# Patient Record
Sex: Male | Born: 1960 | Race: White | Hispanic: No | Marital: Married | Smoking: Never smoker
Health system: Southern US, Community
[De-identification: ages and names within clinical notes are randomized; demographics above are authoritative.]

## PROBLEM LIST (undated history)

## (undated) DIAGNOSIS — S329XXA Fracture of unspecified parts of lumbosacral spine and pelvis, initial encounter for closed fracture: Secondary | ICD-10-CM

## (undated) DIAGNOSIS — S86019A Strain of unspecified Achilles tendon, initial encounter: Secondary | ICD-10-CM

## (undated) DIAGNOSIS — S86012A Strain of left Achilles tendon, initial encounter: Secondary | ICD-10-CM

## (undated) DIAGNOSIS — J302 Other seasonal allergic rhinitis: Secondary | ICD-10-CM

## (undated) DIAGNOSIS — T7840XA Allergy, unspecified, initial encounter: Secondary | ICD-10-CM

## (undated) HISTORY — DX: Other seasonal allergic rhinitis: J30.2

## (undated) HISTORY — DX: Fracture of unspecified parts of lumbosacral spine and pelvis, initial encounter for closed fracture: S32.9XXA

## (undated) HISTORY — DX: Allergy, unspecified, initial encounter: T78.40XA

## (undated) HISTORY — PX: POLYPECTOMY: SHX149

## (undated) HISTORY — PX: SEPTOPLASTY: SUR1290

## (undated) HISTORY — PX: PILONIDAL CYST EXCISION: SHX744

---

## 1989-09-12 HISTORY — PX: OTHER SURGICAL HISTORY: SHX169

## 1989-09-12 HISTORY — PX: KNEE ARTHROSCOPY: SUR90

## 2010-07-09 ENCOUNTER — Ambulatory Visit: Payer: Self-pay | Admitting: Family Medicine

## 2010-07-12 ENCOUNTER — Telehealth: Payer: Self-pay | Admitting: *Deleted

## 2010-07-12 LAB — CONVERTED CEMR LAB
Albumin: 4.4 g/dL (ref 3.5–5.2)
Basophils Absolute: 0 10*3/uL (ref 0.0–0.1)
CO2: 33 meq/L — ABNORMAL HIGH (ref 19–32)
Calcium: 9.2 mg/dL (ref 8.4–10.5)
Cholesterol: 195 mg/dL (ref 0–200)
Eosinophils Absolute: 0.2 10*3/uL (ref 0.0–0.7)
Glucose, Bld: 88 mg/dL (ref 70–99)
HCT: 44 % (ref 39.0–52.0)
HDL: 58.8 mg/dL (ref >39.00)
Hemoglobin: 14.7 g/dL (ref 13.0–17.0)
Lymphs Abs: 2 10*3/uL (ref 0.7–4.0)
MCHC: 33.4 g/dL (ref 30.0–36.0)
Neutro Abs: 2.9 10*3/uL (ref 1.4–7.7)
Potassium: 4.9 meq/L (ref 3.5–5.1)
RDW: 13.1 % (ref 11.5–14.6)
Sodium: 140 meq/L (ref 135–145)
TSH: 0.97 microintl units/mL (ref 0.35–5.50)

## 2010-10-12 NOTE — Assessment & Plan Note (Signed)
Summary: NEW TO EST--REQUESTING CPX--WILL FAST//CCM   Vital Signs:  Patient profile:   50 year old male Height:      74.25 inches Weight:      224 pounds BMI:     28.67 Temp:     97.4 degrees F oral Pulse rate:   60 / minute Pulse rhythm:   regular Resp:     12 per minute BP sitting:   120 / 84  (left arm) Cuff size:   large  Vitals Entered By: Sid Falcon LPN (July 09, 2010 10:36 AM)  Nutrition Counseling: Patient's BMI is greater than 25 and therefore counseled on weight management options.  History of Present Illness: New patient to establish care and for complete physical. Past medical history reviewed. No chronic medical problems. Prior surgeries as outlined. Takes Zyrtec for allergies no other medications. No known drug allergies.  Family history father had stroke and questionable arteriovenous malformation age 68. Father with history of hyperlipidemia. Mother with breast cancer history.  He is married has 2 children. Nonsmoker. Drinks about 2-4 beers per day. Emergency planning/management officer. Exercises about three times per week.  Allergies (verified): No Known Drug Allergies  Past History:  Past Medical History: Last updated: 07/09/2010 HIV testing, manditory for [employer] Hay fever, allergies  Past Surgical History: Last updated: 07/09/2010 Tonsillectomy 1968 Pylonidal Cystectomy 1982 Right knee orthroscops 2001 Septoplasty 2001  Family History: Last updated: 07/09/2010 Family History Breast cancer, mother Father, elevated cholesterol, AV stroke Maternal grandmother, diabetes  Social History: Last updated: 07/09/2010 Occupation:  Emergency planning/management officer Never Smoked Alcohol use-yes Regular exercise-yes  Risk Factors: Exercise: yes (07/09/2010)  Risk Factors: Smoking Status: never (07/09/2010) PMH-FH-SH reviewed for relevance  Family History: Family History Breast cancer, mother Father, elevated cholesterol, AV stroke Maternal grandmother, diabetes  Review of  Systems       The patient complains of weight gain.  The patient denies anorexia, fever, weight loss, vision loss, decreased hearing, hoarseness, chest pain, syncope, dyspnea on exertion, peripheral edema, prolonged cough, headaches, hemoptysis, abdominal pain, melena, hematochezia, severe indigestion/heartburn, hematuria, incontinence, genital sores, muscle weakness, suspicious skin lesions, transient blindness, difficulty walking, depression, unusual weight change, abnormal bleeding, enlarged lymph nodes, and testicular masses.    Physical Exam  General:  Well-developed,well-nourished,in no acute distress; alert,appropriate and cooperative throughout examination Head:  Normocephalic and atraumatic without obvious abnormalities. No apparent alopecia or balding. Eyes:  No corneal or conjunctival inflammation noted. EOMI. Perrla. Funduscopic exam benign, without hemorrhages, exudates or papilledema. Vision grossly normal. Ears:  External ear exam shows no significant lesions or deformities.  Otoscopic examination reveals clear canals, tympanic membranes are intact bilaterally without bulging, retraction, inflammation or discharge. Hearing is grossly normal bilaterally. Mouth:  Oral mucosa and oropharynx without lesions or exudates.  Teeth in good repair. Neck:  No deformities, masses, or tenderness noted. Lungs:  Normal respiratory effort, chest expands symmetrically. Lungs are clear to auscultation, no crackles or wheezes. Heart:  Normal rate and regular rhythm. S1 and S2 normal without gallop, murmur, click, rub or other extra sounds. Abdomen:  Bowel sounds positive,abdomen soft and non-tender without masses, organomegaly or hernias noted. Rectal:  No external abnormalities noted. Normal sphincter tone. No rectal masses or tenderness. Prostate:  Prostate gland firm and smooth, no enlargement, nodularity, tenderness, mass, asymmetry or induration. Msk:  No deformity or scoliosis noted of thoracic or  lumbar spine.   Extremities:  No clubbing, cyanosis, edema, or deformity noted with normal full range of motion of all joints.  Neurologic:  alert & oriented X3 and cranial nerves II-XII intact.   Skin:  no rashes and no suspicious lesions.   Cervical Nodes:  No lymphadenopathy noted Psych:  normally interactive, good eye contact, not anxious appearing, and not depressed appearing.     Impression & Recommendations:  Problem # 1:  Preventive Health Care (ICD-V70.0) Obtain screening lab work. Patient will confirm date of last tetanus. Flu vaccine given. Colonoscopy by next year.  Complete Medication List: 1)  Zyrtec Allergy 10 Mg Tabs (Cetirizine hcl) .... Once daily  Other Orders: Admin 1st Vaccine (47829) Flu Vaccine 44yrs + (56213) TLB-Lipid Panel (80061-LIPID) TLB-BMP (Basic Metabolic Panel-BMET) (80048-METABOL) TLB-CBC Platelet - w/Differential (85025-CBCD) TLB-Hepatic/Liver Function Pnl (80076-HEPATIC) TLB-TSH (Thyroid Stimulating Hormone) (84443-TSH) Venipuncture (08657) Specimen Handling (84696)  Patient Instructions: 1)  It is important that you exercise reguarly at least 20 minutes 5 times a week. If you develop chest pain, have severe difficulty breathing, or feel very tired, stop exercising immediately and seek medical attention.  2)  You need to lose weight. Consider a lower calorie diet and regular exercise.    Orders Added: 1)  Admin 1st Vaccine [90471] 2)  Flu Vaccine 66yrs + [90658] 3)  New Patient 40-64 years [99386] 4)  TLB-Lipid Panel [80061-LIPID] 5)  TLB-BMP (Basic Metabolic Panel-BMET) [80048-METABOL] 6)  TLB-CBC Platelet - w/Differential [85025-CBCD] 7)  TLB-Hepatic/Liver Function Pnl [80076-HEPATIC] 8)  TLB-TSH (Thyroid Stimulating Hormone) [84443-TSH] 9)  Venipuncture [29528] 10)  Specimen Handling [99000]    Flu Vaccine Consent Questions     Do you have a history of severe allergic reactions to this vaccine? no    Any prior history of allergic  reactions to egg and/or gelatin? no    Do you have a sensitivity to the preservative Thimersol? no    Do you have a past history of Guillan-Barre Syndrome? no    Do you currently have an acute febrile illness? no    Have you ever had a severe reaction to latex? no    Vaccine information given and explained to patient? yes    Are you currently pregnant? no    Lot Number:[id number]   Exp Date:03/12/2011   Site Given  Left Deltoid IM .lbflu1

## 2010-10-12 NOTE — Progress Notes (Signed)
Summary: Historical Td, 2009  Phone Note Call from Patient   Caller: Patient  [phone] Summary of Call: Pt called to adv that the last date of his most recent Tetanus shot was   5.2.2009.Marland KitchenMarland KitchenMarland KitchenMarland Kitchen Pt would like this info added to his chart.  Initial call taken by: Debbra Riding,  July 12, 2010 9:04 AM      Immunization History:  Tetanus/Td Immunization History:    Tetanus/Td:  historical (01/12/2008)

## 2011-04-21 ENCOUNTER — Emergency Department (HOSPITAL_BASED_OUTPATIENT_CLINIC_OR_DEPARTMENT_OTHER)
Admission: EM | Admit: 2011-04-21 | Discharge: 2011-04-21 | Disposition: A | Discharge status: Home / Self Care | Payer: BC Managed Care – PPO | Attending: Emergency Medicine | Admitting: Emergency Medicine

## 2011-04-21 ENCOUNTER — Emergency Department (INDEPENDENT_AMBULATORY_CARE_PROVIDER_SITE_OTHER): Payer: BC Managed Care – PPO

## 2011-04-21 DIAGNOSIS — R079 Chest pain, unspecified: Secondary | ICD-10-CM

## 2011-04-21 DIAGNOSIS — S2239XA Fracture of one rib, unspecified side, initial encounter for closed fracture: Secondary | ICD-10-CM

## 2011-04-21 MED ORDER — OXYCODONE-ACETAMINOPHEN 5-325 MG PO TABS
2.0000 | ORAL_TABLET | Freq: Once | ORAL | Status: AC
  Administered 2011-04-21: 2 via ORAL
  Filled 2011-04-21: qty 2

## 2011-04-21 MED ORDER — OXYCODONE-ACETAMINOPHEN 5-325 MG PO TABS: 1.0000 | ORAL_TABLET | Freq: Four times a day (QID) | ORAL | Status: AC | PRN

## 2011-04-21 MED ORDER — OXYCODONE HCL 10 MG PO TB12: 10.0000 mg | ORAL_TABLET | Freq: Two times a day (BID) | ORAL | Status: AC
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
## 2011-04-21 NOTE — ED Notes (Signed)
Pt transported to xray, NAD noted.
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
## 2011-09-10 ENCOUNTER — Encounter: Payer: Self-pay | Admitting: Internal Medicine

## 2011-09-10 ENCOUNTER — Ambulatory Visit (INDEPENDENT_AMBULATORY_CARE_PROVIDER_SITE_OTHER): Payer: BC Managed Care – PPO | Admitting: Internal Medicine

## 2011-09-10 VITALS — BP 106/68 | HR 86 | Temp 99.3°F | Resp 14 | Wt 233.0 lb

## 2011-09-10 DIAGNOSIS — R509 Fever, unspecified: Secondary | ICD-10-CM

## 2011-09-10 MED ORDER — AMOXICILLIN 875 MG PO TABS: 875.0000 mg | ORAL_TABLET | Freq: Two times a day (BID) | ORAL | Status: AC

## 2011-09-10 NOTE — Patient Instructions (Signed)
Febrile illness with joint swelling and pain is atypical for influenza and suggests another type of viral infection vs a streptococcal infection. Plan - Amoxicillin 875 mg twice a day; for joint pain it is ok to take an anti-inflammatory drug, e.g. Advil, Aleve; for fever take tylenol 500 mg 1 or 2 three times a day. For persistent fevers or joint pain you will need further evaluation and lab work.

## 2011-09-10 NOTE — Progress Notes (Signed)
  Subjective:    Patient ID: Dean Lewis, male    DOB: 11-Apr-1961, 50 y.o.   MRN: [id number]  HPI Dean Lewis presents for a 1 week h/o fevers, not quantitated, joint and muscle pain, swollen joints. No cough, no congestion, no SOB, no N/V, no sore throat. He is generally healthy.  No past medical history on file. Past Surgical History  Procedure Date  . Pilonidal cyst excision   . Septoplasty    No family history on file. History   Social History  . Marital Status: Married    Spouse Name: N/A    Number of Children: N/A  . Years of Education: N/A   Occupational History  . Not on file.   Social History Main Topics  . Smoking status: Never Smoker   . Smokeless tobacco: Not on file  . Alcohol Use: Yes  . Drug Use: No  . Sexually Active:    Other Topics Concern  . Not on file   Social History Narrative  . No narrative on file       Review of Systems System review is negative for any constitutional, cardiac, pulmonary, GI or neuro symptoms or complaints other than as described in the HPI.     Objective:   Physical Exam Vitals reviewed - low grade fever Gen'l- WNWD white man in no distress HEENT- no sinus tenderness to percussion, throat clear Neck- supple Nodes - negative submandibular, cervical regions Chest - CTAP Cor - RRR w/o murmur Abd - soft Ext - hands, shoulder appear normal. Ankles with 2+ swelling, mild erythema, heat and tenderness Neuro - A&O x 3       Assessment & Plan:  Febrile illness - patient's symptoms are not consistent with influenza. Swollen and inflamed joints are worrisome. Differential includes various viral issues vs streptococcal illness.  Plan - amoxicillin 875 mg bid x 7 days           APAP for fever and discomfort          OTC NSAID of choice for joint pain           For worsening symptoms or failure to clear his symptoms he will need further evaluation to include CBC, ASO, ESR/CRP, repeat physical exam.

## 2011-09-12 ENCOUNTER — Telehealth: Payer: Self-pay

## 2011-09-12 NOTE — Telephone Encounter (Signed)
[facility] Triage Call Report Triage Record Num: [id number] Operator: Tomasita Crumble Patient Name: Dean Lewis Call Date & Time: 09/10/2011 9:06:52AM Patient Phone: [phone] PCP: Evelena Peat Patient Gender: Male PCP Fax : [phone] Patient DOB: October 13, 1960 Practice Name: [facility] Reason for Call: Caller: Millard/Patient; PCP: Caryl Never (Burr-shet), Elberta Fortis.; CB#: [id number]; Call Reason: Chills Body Ache and Fever; Sx Onset: 09/03/2011; Sx Notes:Febrile/subjective - est. 99-100 onset 12/20. Ankles swollen and painful - est twice normal size. Home treatment(s) tried: Antihistamine, Advil; Aleve; Did home treatment help?: No; Guideline Used:Ankle Non-Injury ; Disp:See in 4 hours; Appt Scheduled?: Transferred to Diane at office for appointment. Protocol(s) Used: Ankle Non-Injury Recommended Outcome per Protocol: See Provider within 4 hours Reason for Outcome: New marked swelling (twice the normal size as compared to usual appearance) Care Advice: Analgesic/Antipyretic Advice - Acetaminophen: Consider acetaminophen as directed on label or by pharmacist/provider for pain or fever PRECAUTIONS: - Use if there is no history of liver disease, alcoholism, or intake of three or more alcohol drinks per day - Only if approved by provider during pregnancy or when breastfeeding - During pregnancy, acetaminophen should not be taken more than 3 consecutive days without telling provider - Do not exceed recommended dose or frequency ~ 09/10/2011 9:17:13AM Page 1 of 1 CAN_TriageRpt_V2

## 2011-09-13 HISTORY — PX: COLONOSCOPY: SHX174

## 2011-09-14 ENCOUNTER — Encounter: Payer: Self-pay | Admitting: Family Medicine

## 2011-09-15 ENCOUNTER — Ambulatory Visit (INDEPENDENT_AMBULATORY_CARE_PROVIDER_SITE_OTHER): Payer: BC Managed Care – PPO | Admitting: Family Medicine

## 2011-09-15 ENCOUNTER — Encounter: Payer: Self-pay | Admitting: Family Medicine

## 2011-09-15 VITALS — BP 90/60 | Temp 98.3°F | Wt 233.0 lb

## 2011-09-15 DIAGNOSIS — R6883 Chills (without fever): Secondary | ICD-10-CM

## 2011-09-15 DIAGNOSIS — R5381 Other malaise: Secondary | ICD-10-CM

## 2011-09-15 DIAGNOSIS — R609 Edema, unspecified: Secondary | ICD-10-CM

## 2011-09-15 DIAGNOSIS — M25473 Effusion, unspecified ankle: Secondary | ICD-10-CM

## 2011-09-15 DIAGNOSIS — R5383 Other fatigue: Secondary | ICD-10-CM

## 2011-09-15 LAB — POCT URINALYSIS DIPSTICK
Blood, UA: NEGATIVE
Spec Grav, UA: 1.02
pH, UA: 6

## 2011-09-15 LAB — CBC WITH DIFFERENTIAL/PLATELET
Basophils Relative: 0.5 % (ref 0.0–3.0)
Eosinophils Absolute: 0.1 10*3/uL (ref 0.0–0.7)
Hemoglobin: 14.1 g/dL (ref 13.0–17.0)
MCHC: 34 g/dL (ref 30.0–36.0)
MCV: 88.5 fl (ref 78.0–100.0)
Monocytes Absolute: 0.9 10*3/uL (ref 0.1–1.0)
Neutro Abs: 7.2 10*3/uL (ref 1.4–7.7)
RBC: 4.67 Mil/uL (ref 4.22–5.81)

## 2011-09-15 LAB — BASIC METABOLIC PANEL
Chloride: 103 mEq/L (ref 96–112)
Potassium: 3.5 mEq/L (ref 3.5–5.1)
Sodium: 140 mEq/L (ref 135–145)

## 2011-09-15 NOTE — Progress Notes (Addendum)
  Subjective:    Patient ID: Dean Lewis, male    DOB: Sep 13, 1960, 51 y.o.   MRN: [id number]  HPI  Acute visit. Approximately 3 week history of nonspecific symptoms of malaise body aches and arthralgias mostly involving ankles. Has ankle edema, warmth, and some mild redness over the past couple weeks. Was seen Saturday work in clinic and prescribed amoxicillin. He denies any recent sore throat, cough, abdominal pain, skin rash, headache, or any adenopathy. He had frequent chills and night sweats but temperature not taken. Increased fatigue. Poor appetite. No recent tick bites. No sick exposures. No weight changes.  Taken ibuprofen with minimal relief of ankle pain.   denies any upper extremity arthralgias   Review of Systems  Constitutional: Positive for chills, appetite change and fatigue. Negative for fever and unexpected weight change.  HENT: Negative for neck stiffness.   Respiratory: Negative for cough and shortness of breath.   Gastrointestinal: Negative for nausea, vomiting, abdominal pain and diarrhea.  Genitourinary: Negative for dysuria.  Musculoskeletal: Positive for joint swelling and arthralgias.  Skin: Negative for rash.  Neurological: Negative for headaches.       Objective:   Physical Exam  Constitutional: He appears well-developed and well-nourished. No distress.  HENT:  Right Ear: External ear normal.  Left Ear: External ear normal.  Mouth/Throat: Oropharynx is clear and moist.  Neck: Neck supple. No thyromegaly present.  Cardiovascular: Normal rate, regular rhythm and normal heart sounds.   Pulmonary/Chest: Effort normal and breath sounds normal. No respiratory distress. He has no wheezes. He has no rales.  Abdominal: Soft. He exhibits no distension and no mass. There is no tenderness. There is no rebound and no guarding.  Musculoskeletal: He exhibits edema.       Bilateral ankle edema with mild foot edema. Some mild erythema mostly medial aspect both ankles  with slight warmth and slight tenderness  Lymphadenopathy:    He has no cervical adenopathy.  Skin: No rash noted.          Assessment & Plan:  3 week history of constitutional symptoms of fatigue and malaise and poor appetite. He has objective ankle edema bilateral with inflammatory changes of warmth and mild erythema. Question post viral. Check labs of CBC, urine dipstick, basic metabolic panel, ASO titer, sedimentation rate, ANA, and rheumatoid factor.   Clinical improvement with prednisone but symptoms of edema have returned as prednisone tapered. Call in prednisone refill. Set up rheumatology referral

## 2011-09-16 ENCOUNTER — Other Ambulatory Visit: Payer: Self-pay | Admitting: Family Medicine

## 2011-09-16 MED ORDER — PREDNISONE 20 MG PO TABS: ORAL_TABLET | ORAL | Status: DC

## 2011-09-16 NOTE — Progress Notes (Signed)
Quick Note:  Will send prednisone to pt pharmacy ______

## 2011-09-20 ENCOUNTER — Telehealth: Payer: Self-pay | Admitting: *Deleted

## 2011-09-20 NOTE — Telephone Encounter (Signed)
Pt called as instructed to update Dr Rachel Bo on sx.  He does feel better on the prednisone, still has some minor ankle swelling.  Concerned about what will happen off the Prednisone in a few days. [phone]

## 2011-09-20 NOTE — Telephone Encounter (Signed)
Spoke with patient. He states he is almost 100% improved. Still some very mild edema. No fever. Overall feels well. Wants to resume exercising. He'll finish out prednisone to be in touch if he has recurrent swelling.

## 2011-09-26 ENCOUNTER — Telehealth: Payer: Self-pay | Admitting: Family Medicine

## 2011-09-26 MED ORDER — PREDNISONE 20 MG PO TABS: ORAL_TABLET | ORAL | Status: DC

## 2011-09-26 NOTE — Telephone Encounter (Signed)
rx sent to pharmacy

## 2011-09-26 NOTE — Telephone Encounter (Signed)
Refill prednisone once and will set up rheumatology referral.

## 2011-09-26 NOTE — Telephone Encounter (Signed)
Was told to call back if sxs came back to be referred to a rheumatologist. Please refer. Thanks.

## 2011-09-26 NOTE — Telephone Encounter (Signed)
He is also requesting another refill of his prednisone to be [facility]. Thanks.

## 2011-09-26 NOTE — Progress Notes (Signed)
Addended by: Kristian Covey on: 09/26/2011 10:19 AM   Modules accepted: Orders

## 2011-10-03 ENCOUNTER — Telehealth: Payer: Self-pay | Admitting: Family Medicine

## 2011-10-03 NOTE — Telephone Encounter (Signed)
Refill [facility] at [facility] until Rheumatology appt on 10-17-2011. Thanks.

## 2011-10-04 NOTE — Telephone Encounter (Signed)
Pt informed.  He is taking his last prednisone today and is concerned.  He will call back if swelling becomes severe

## 2011-10-04 NOTE — Telephone Encounter (Signed)
I would not recommend maintaining until then unless swelling is severe for two reasons:  With prolonged use, increased risk of adrenal suppression and would require slow taper off. I would like for rheumatologist to evaluate off prednisone if possible

## 2011-11-24 ENCOUNTER — Other Ambulatory Visit (INDEPENDENT_AMBULATORY_CARE_PROVIDER_SITE_OTHER): Payer: BC Managed Care – PPO

## 2011-11-24 DIAGNOSIS — Z0000 Encounter for general adult medical examination without abnormal findings: Secondary | ICD-10-CM

## 2011-11-24 LAB — LIPID PANEL
HDL: 64.5 mg/dL (ref >39.00)
Triglycerides: 67 mg/dL (ref 0.0–149.0)

## 2011-11-24 LAB — CBC WITH DIFFERENTIAL/PLATELET
Basophils Absolute: 0 10*3/uL (ref 0.0–0.1)
HCT: 46.5 % (ref 39.0–52.0)
Hemoglobin: 15.6 g/dL (ref 13.0–17.0)
Lymphs Abs: 1.3 10*3/uL (ref 0.7–4.0)
MCHC: 33.7 g/dL (ref 30.0–36.0)
Monocytes Relative: 10.8 % (ref 3.0–12.0)
Neutro Abs: 2.9 10*3/uL (ref 1.4–7.7)
RDW: 13.9 % (ref 11.5–14.6)

## 2011-11-24 LAB — POCT URINALYSIS DIPSTICK
Bilirubin, UA: NEGATIVE
Glucose, UA: NEGATIVE
Leukocytes, UA: NEGATIVE
Nitrite, UA: NEGATIVE
Urobilinogen, UA: 0.2

## 2011-11-24 LAB — BASIC METABOLIC PANEL
CO2: 27 mEq/L (ref 19–32)
GFR: 122.44 mL/min (ref >60.00)
Glucose, Bld: 94 mg/dL (ref 70–99)
Potassium: 4 mEq/L (ref 3.5–5.1)
Sodium: 141 mEq/L (ref 135–145)

## 2011-11-24 LAB — HEPATIC FUNCTION PANEL
Bilirubin, Direct: 0.1 mg/dL (ref 0.0–0.3)
Total Bilirubin: 0.4 mg/dL (ref 0.3–1.2)

## 2011-11-24 LAB — PSA: PSA: 0.81 ng/mL (ref 0.10–4.00)

## 2011-12-01 ENCOUNTER — Encounter: Payer: Self-pay | Admitting: Family Medicine

## 2011-12-01 ENCOUNTER — Ambulatory Visit (INDEPENDENT_AMBULATORY_CARE_PROVIDER_SITE_OTHER): Payer: BC Managed Care – PPO | Admitting: Family Medicine

## 2011-12-01 VITALS — BP 120/80 | Temp 97.6°F | Ht 74.0 in | Wt 232.0 lb

## 2011-12-01 DIAGNOSIS — Z0000 Encounter for general adult medical examination without abnormal findings: Secondary | ICD-10-CM

## 2011-12-01 NOTE — Patient Instructions (Signed)
Get back to regular exercise and work on weight loss Reduce alcohol beverages to  2 or less per day. We will call you regarding colonoscopy

## 2011-12-01 NOTE — Progress Notes (Signed)
  Subjective:    Patient ID: Dean Lewis, male    DOB: 10/03/1960, 51 y.o.   MRN: [id number]  HPI  Patient seen for complete physical. 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 His labs and exam have returned to normal at this time. Has been followed by rheumatology.  He had significant lower extremity edema which has resolved. This had improved with prednisone. He had significantly elevated C. reactive protein and other markers were negative.  No significant chronic medical problems. Takes no regular medications. Tetanus up-to-date. Has not done much exercise over the winter and had some gradual weight gain. No history of colon cancer screening.  No past medical history on file. Past Surgical History  Procedure Date  . Pilonidal cyst excision   . Septoplasty   . Knee arthroscopy 1991    R knee    reports that he has never smoked. He does not have any smokeless tobacco history on file. He reports that he drinks alcohol. He reports that he does not use illicit drugs. family history includes Cancer in his mother; Diabetes in his maternal grandmother; Hyperlipidemia in his father; and Stroke in his father. No Known Allergies    Review of Systems  Constitutional: Negative for fever, activity change, appetite change and fatigue.  HENT: Negative for ear pain, congestion and trouble swallowing.   Eyes: Negative for pain and visual disturbance.  Respiratory: Negative for cough, shortness of breath and wheezing.   Cardiovascular: Negative for chest pain and palpitations.  Gastrointestinal: Negative for nausea, vomiting, abdominal pain, diarrhea, constipation, blood in stool, abdominal distention and rectal pain.  Genitourinary: Negative for dysuria, hematuria and testicular pain.  Musculoskeletal: Negative for joint swelling and arthralgias.  Skin: Negative for rash.  Neurological: Negative for  dizziness, syncope and headaches.  Hematological: Negative for adenopathy.  Psychiatric/Behavioral: Negative for confusion and dysphoric mood.       Objective:   Physical Exam  Constitutional: He is oriented to person, place, and time. He appears well-developed and well-nourished. No distress.  HENT:  Head: Normocephalic and atraumatic.  Right Ear: External ear normal.  Left Ear: External ear normal.  Mouth/Throat: Oropharynx is clear and moist.  Eyes: Conjunctivae and EOM are normal. Pupils are equal, round, and reactive to light.  Neck: Normal range of motion. Neck supple. No thyromegaly present.  Cardiovascular: Normal rate, regular rhythm and normal heart sounds.   No murmur heard. Pulmonary/Chest: No respiratory distress. He has no wheezes. He has no rales.  Abdominal: Soft. Bowel sounds are normal. He exhibits no distension and no mass. There is no tenderness. There is no rebound and no guarding.  Musculoskeletal: He exhibits no edema.  Lymphadenopathy:    He has no cervical adenopathy.  Neurological: He is alert and oriented to person, place, and time. He displays normal reflexes. No cranial nerve deficit.  Skin: No rash noted.       No concerning skin lesions.  Psychiatric: He has a normal mood and affect.          Assessment & Plan:  Complete physical. Schedule screening colonoscopy. Reduce alcohol consumption to no more than 2 beverages per day. Work on weight loss and establish more regular exercise. Labs reviewed with patient and all favorable

## 2011-12-07 ENCOUNTER — Encounter: Payer: Self-pay | Admitting: Gastroenterology

## 2012-01-04 ENCOUNTER — Ambulatory Visit (AMBULATORY_SURGERY_CENTER): Payer: BC Managed Care – PPO | Admitting: *Deleted

## 2012-01-04 VITALS — Ht 74.0 in | Wt 237.7 lb

## 2012-01-04 DIAGNOSIS — Z1211 Encounter for screening for malignant neoplasm of colon: Secondary | ICD-10-CM

## 2012-01-04 MED ORDER — MOVIPREP 100 G PO SOLR: ORAL | Status: DC

## 2012-01-05 ENCOUNTER — Encounter: Payer: Self-pay | Admitting: Gastroenterology

## 2012-01-18 ENCOUNTER — Ambulatory Visit (AMBULATORY_SURGERY_CENTER): Payer: BC Managed Care – PPO | Admitting: Gastroenterology

## 2012-01-18 ENCOUNTER — Encounter: Payer: Self-pay | Admitting: Gastroenterology

## 2012-01-18 VITALS — BP 114/81 | HR 70 | Temp 97.3°F | Resp 25 | Ht 74.0 in | Wt 237.0 lb

## 2012-01-18 DIAGNOSIS — D128 Benign neoplasm of rectum: Secondary | ICD-10-CM

## 2012-01-18 DIAGNOSIS — D129 Benign neoplasm of anus and anal canal: Secondary | ICD-10-CM

## 2012-01-18 DIAGNOSIS — D126 Benign neoplasm of colon, unspecified: Secondary | ICD-10-CM

## 2012-01-18 DIAGNOSIS — Z1211 Encounter for screening for malignant neoplasm of colon: Secondary | ICD-10-CM

## 2012-01-18 MED ORDER — SODIUM CHLORIDE 0.9 % IV SOLN: 500.0000 mL | INTRAVENOUS | Status: DC

## 2012-01-18 NOTE — Patient Instructions (Signed)
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
## 2012-01-18 NOTE — Op Note (Signed)
[facility] [address] [place], [place]  [address]  COLONOSCOPY PROCEDURE REPORT  PATIENT:  Dean, Lewis  MR#:  [id number] BIRTHDATE:  1961/03/21, 50 yrs. old  GENDER:  male ENDOSCOPIST:  Vania Rea. Jarold Motto, MD, [facility] [address] REF. BY:  Evelena Peat, M.D. PROCEDURE DATE:  01/18/2012 PROCEDURE:  Colonoscopy with snare polypectomy ASA CLASS:  Class I INDICATIONS:  family Hx of polyps MOTHER MEDICATIONS:   propofol (Diprivan) 250 mg IV  DESCRIPTION OF PROCEDURE:   After the risks and benefits and of the procedure were explained, informed consent was obtained. Digital rectal exam was performed and revealed no abnormalities. The LB [id number] endoscope was introduced through the anus and advanced to the cecum, which was identified by both the appendix and ileocecal valve.  The quality of the prep was excellent, using MoviPrep.  The instrument was then slowly withdrawn as the colon was fully examined. <<PROCEDUREIMAGES>>  FINDINGS:  A sessile polyp was found in the rectum. 5 MM FLAT RECTAL POLYP HOT SNARE REMOVED.   Retroflexed views in the rectum revealed no abnormalities.    The scope was then withdrawn from the patient and the procedure completed.  COMPLICATIONS:  None ENDOSCOPIC IMPRESSION: 1) Sessile polyp in the rectum R/O ADENOMA. RECOMMENDATIONS: 1) Await biopsy results 2) Repeat colonoscopy in 5 years if polyp adenomatous; otherwise 10 years  REPEAT EXAM:  No  ______________________________ Vania Rea. Jarold Motto, MD, [facility]  CC:  n. eSIGNED:   Vania Rea. Askari Kinley at 01/18/2012 09:33 AM  Isidoro Donning, [id number]

## 2012-01-19 ENCOUNTER — Telehealth: Payer: Self-pay | Admitting: *Deleted

## 2012-01-19 NOTE — Telephone Encounter (Signed)
  Follow up Call-  Call back number 01/18/2012  Post procedure Call Back phone  # [phone]  Permission to leave phone message Yes     Patient questions:  Do you have a fever, pain , or abdominal swelling? no Pain Score  0 *  Have you tolerated food without any problems? yes  Have you been able to return to your normal activities? yes  Do you have any questions about your discharge instructions: Diet   no Medications  no Follow up visit  no  Do you have questions or concerns about your Care? no  Actions: * If pain score is 4 or above: No action needed, pain <4.

## 2012-01-24 ENCOUNTER — Encounter: Payer: Self-pay | Admitting: Gastroenterology

## 2012-11-23 ENCOUNTER — Other Ambulatory Visit (INDEPENDENT_AMBULATORY_CARE_PROVIDER_SITE_OTHER): Payer: No Typology Code available for payment source

## 2012-11-23 DIAGNOSIS — Z0000 Encounter for general adult medical examination without abnormal findings: Secondary | ICD-10-CM

## 2012-11-23 LAB — POCT URINALYSIS DIPSTICK
Glucose, UA: NEGATIVE
Ketones, UA: NEGATIVE
Spec Grav, UA: 1.01

## 2012-11-23 LAB — CBC WITH DIFFERENTIAL/PLATELET
Eosinophils Absolute: 0.2 10*3/uL (ref 0.0–0.7)
Eosinophils Relative: 4.5 % (ref 0.0–5.0)
HCT: 45.4 % (ref 39.0–52.0)
Lymphs Abs: 1.6 10*3/uL (ref 0.7–4.0)
MCHC: 34.2 g/dL (ref 30.0–36.0)
MCV: 87.6 fl (ref 78.0–100.0)
Monocytes Absolute: 0.6 10*3/uL (ref 0.1–1.0)
Platelets: 226 10*3/uL (ref 150.0–400.0)
RDW: 12.9 % (ref 11.5–14.6)
WBC: 5.2 10*3/uL (ref 4.5–10.5)

## 2012-11-23 LAB — BASIC METABOLIC PANEL
Calcium: 9.3 mg/dL (ref 8.4–10.5)
GFR: 106.46 mL/min (ref >60.00)
Glucose, Bld: 102 mg/dL — ABNORMAL HIGH (ref 70–99)
Sodium: 140 mEq/L (ref 135–145)

## 2012-11-23 LAB — PSA: PSA: 0.76 ng/mL (ref 0.10–4.00)

## 2012-11-23 LAB — TSH: TSH: 1.28 u[IU]/mL (ref 0.35–5.50)

## 2012-11-23 LAB — LIPID PANEL
Cholesterol: 210 mg/dL — ABNORMAL HIGH (ref 0–200)
HDL: 53.8 mg/dL (ref >39.00)

## 2012-11-23 LAB — HEPATIC FUNCTION PANEL
Alkaline Phosphatase: 55 U/L (ref 39–117)
Bilirubin, Direct: 0.1 mg/dL (ref 0.0–0.3)

## 2012-11-30 ENCOUNTER — Ambulatory Visit (INDEPENDENT_AMBULATORY_CARE_PROVIDER_SITE_OTHER): Payer: No Typology Code available for payment source | Admitting: Family Medicine

## 2012-11-30 ENCOUNTER — Encounter: Payer: Self-pay | Admitting: Family Medicine

## 2012-11-30 VITALS — BP 110/72 | HR 80 | Temp 98.4°F | Resp 12 | Ht 74.0 in | Wt 238.0 lb

## 2012-11-30 DIAGNOSIS — Z0000 Encounter for general adult medical examination without abnormal findings: Secondary | ICD-10-CM

## 2012-11-30 NOTE — Patient Instructions (Addendum)
Try to establish more regular exercise and lose some weight.

## 2012-11-30 NOTE — Progress Notes (Signed)
  Subjective:    Patient ID: Dean Lewis, male    DOB: 02-Dec-1960, 52 y.o.   MRN: [id number]  HPI Patient seen for complete physical /well visit. He's had colonoscopy in the past year showed benign polyps. Tetanus 2009. Had some mild weight gain during past year. Hopes to start exercising soon more consistently.  No chronic medical problems. Takes Zyrtec for allergies but no other medications.  Past Medical History  Diagnosis Date  . Seasonal allergies    Past Surgical History  Procedure Laterality Date  . Pilonidal cyst excision    . Septoplasty    . Knee arthroscopy  1991    R knee    reports that he has never smoked. He does not have any smokeless tobacco history on file. He reports that  drinks alcohol. He reports that he does not use illicit drugs. family history includes Breast cancer in his mother; Cancer in his mother; Diabetes in his maternal grandmother; Hyperlipidemia in his father; and Stroke (age of onset: 37) in his father. No Known Allergies    Review of Systems  Constitutional: Negative for fever, activity change, appetite change and fatigue.  HENT: Negative for ear pain, congestion and trouble swallowing.   Eyes: Negative for pain and visual disturbance.  Respiratory: Negative for cough, shortness of breath and wheezing.   Cardiovascular: Negative for chest pain and palpitations.  Gastrointestinal: Negative for nausea, vomiting, abdominal pain, diarrhea, constipation, blood in stool, abdominal distention and rectal pain.  Genitourinary: Negative for dysuria, hematuria and testicular pain.  Musculoskeletal: Negative for joint swelling and arthralgias.  Skin: Negative for rash.  Neurological: Negative for dizziness, syncope and headaches.  Hematological: Negative for adenopathy.  Psychiatric/Behavioral: Negative for confusion and dysphoric mood.       Objective:   Physical Exam  Constitutional: He is oriented to person, place, and time. He appears  well-developed and well-nourished. No distress.  HENT:  Head: Normocephalic and atraumatic.  Right Ear: External ear normal.  Left Ear: External ear normal.  Mouth/Throat: Oropharynx is clear and moist.  Eyes: Conjunctivae and EOM are normal. Pupils are equal, round, and reactive to light.  Neck: Normal range of motion. Neck supple. No thyromegaly present.  Cardiovascular: Normal rate, regular rhythm and normal heart sounds.   No murmur heard. Pulmonary/Chest: No respiratory distress. He has no wheezes. He has no rales.  Abdominal: Soft. Bowel sounds are normal. He exhibits no distension and no mass. There is no tenderness. There is no rebound and no guarding.  Musculoskeletal: He exhibits no edema.  Lymphadenopathy:    He has no cervical adenopathy.  Neurological: He is alert and oriented to person, place, and time. He displays normal reflexes. No cranial nerve deficit.  Skin: No rash noted.  No concerning skin lesions  Psychiatric: He has a normal mood and affect.          Assessment & Plan:  Complete physical. Tetanus up-to-date. Colonoscopy up to date. PSA normal. Labs reviewed with patient. Mildly elevated glucose 102. Work on weight loss and more consistent exercise. Followup in one year for physical

## 2013-06-14 ENCOUNTER — Encounter: Payer: Self-pay | Admitting: Family Medicine

## 2013-06-14 ENCOUNTER — Ambulatory Visit (INDEPENDENT_AMBULATORY_CARE_PROVIDER_SITE_OTHER): Payer: No Typology Code available for payment source | Admitting: Family Medicine

## 2013-06-14 VITALS — BP 102/60 | HR 66 | Temp 98.1°F | Wt 239.0 lb

## 2013-06-14 DIAGNOSIS — B078 Other viral warts: Secondary | ICD-10-CM

## 2013-06-14 DIAGNOSIS — Z23 Encounter for immunization: Secondary | ICD-10-CM

## 2013-06-14 MED ORDER — CIMETIDINE 800 MG PO TABS: 800.0000 mg | ORAL_TABLET | Freq: Three times a day (TID) | ORAL | Status: DC

## 2013-06-14 NOTE — Progress Notes (Signed)
  Subjective:    Patient ID: Dean Lewis, male    DOB: 23-Jul-1961, 52 y.o.   MRN: [id number]  HPI  Patient has common warts both hands He's had these previously there been treated with several topicals including salicylic acid and cryotherapy without success. His daughter has had similar problems and she recently took course of Tagamet which helped resolve. He has multiple warts including 2 on the right hand and 6 on the left hand.  Past Medical History  Diagnosis Date  . Seasonal allergies    Past Surgical History  Procedure Laterality Date  . Pilonidal cyst excision    . Septoplasty    . Knee arthroscopy  1991    R knee    reports that he has never smoked. He does not have any smokeless tobacco history on file. He reports that  drinks alcohol. He reports that he does not use illicit drugs. family history includes Breast cancer in his mother; Cancer in his mother; Diabetes in his maternal grandmother; Hyperlipidemia in his father; Stroke (age of onset: 51) in his father. No Known Allergies    Review of Systems  Skin: Negative for rash.       Objective:   Physical Exam  Constitutional: He appears well-developed and well-nourished.  Cardiovascular: Normal rate and regular rhythm.   Skin:  Patient several typical common warts including 2 on the right hand and 6 scattered on the left hand involving several digits          Assessment & Plan:  Common warts. We discussed treatment options. He has already tried multiple topicals as well as duct taping and cryotherapy without success. We discussed oral treatments which he is expressed in explicit interest in. We discussed the fact that Tagamet has inconclusive results with some trials showing benefit and some not. We elected to try Tagamet 800 mg 3 times a day

## 2013-06-14 NOTE — Patient Instructions (Addendum)
Warts  Warts are a common viral infection. They are most commonly caused by the human papillomavirus (HPV). Warts can occur at all ages. However, they occur most frequently in older children and infrequently in the elderly. Warts may be single or multiple. Location and size varies. Warts can be spread by scratching the wart and then scratching normal skin. The life cycle of warts varies. However, most will disappear over many months to a couple years. Warts commonly do not cause problems (asymptomatic) unless they are over an area of pressure, such as the bottom of the foot. If they are large enough, they may cause pain with walking.  DIAGNOSIS   Warts are most commonly diagnosed by their appearance. Tissue samples (biopsies) are not required unless the wart looks abnormal. Most warts have a rough surface, are round, oval, or irregular, and are skin-colored to light yellow, brown, or gray. They are generally less than  inch (1.3 cm), but they can be any size.  TREATMENT    Observation or no treatment.   Freezing with liquid nitrogen.   High heat (cautery).   Boosting the body's immunity to fight off the wart (immunotherapy using Candida antigen).   Laser surgery.   Application of various irritants and solutions.  HOME CARE INSTRUCTIONS   Follow your caregiver's instructions. No special precautions are necessary. Often, treatment may be followed by a return (recurrence) of warts. Warts are generally difficult to treat and get rid of. If treatment is done in a clinic setting, usually more than 1 treatment is required. This is usually done on only a monthly basis until the wart is completely gone.  SEEK IMMEDIATE MEDICAL CARE IF:  The treated skin becomes red, puffy (swollen), or painful.  Document Released: 06/08/2005 Document Revised: 11/21/2011 Document Reviewed: 12/04/2009  Exit[employer] Patient Information 2014 [employer].

## 2014-06-17 ENCOUNTER — Other Ambulatory Visit (INDEPENDENT_AMBULATORY_CARE_PROVIDER_SITE_OTHER): Payer: No Typology Code available for payment source

## 2014-06-17 DIAGNOSIS — Z0000 Encounter for general adult medical examination without abnormal findings: Secondary | ICD-10-CM

## 2014-06-17 LAB — POCT URINALYSIS DIPSTICK
BILIRUBIN UA: NEGATIVE
Blood, UA: NEGATIVE
Glucose, UA: NEGATIVE
KETONES UA: NEGATIVE
LEUKOCYTES UA: NEGATIVE
Nitrite, UA: NEGATIVE
PROTEIN UA: NEGATIVE
SPEC GRAV UA: 1.015
Urobilinogen, UA: 0.2
pH, UA: 7.5

## 2014-06-17 LAB — CBC WITH DIFFERENTIAL/PLATELET
BASOS ABS: 0.1 10*3/uL (ref 0.0–0.1)
BASOS PCT: 1 % (ref 0.0–3.0)
Eosinophils Absolute: 0.4 10*3/uL (ref 0.0–0.7)
Eosinophils Relative: 7.2 % — ABNORMAL HIGH (ref 0.0–5.0)
HCT: 46.9 % (ref 39.0–52.0)
HEMOGLOBIN: 15.9 g/dL (ref 13.0–17.0)
LYMPHS PCT: 27.8 % (ref 12.0–46.0)
Lymphs Abs: 1.5 10*3/uL (ref 0.7–4.0)
MCHC: 33.9 g/dL (ref 30.0–36.0)
MCV: 88.1 fl (ref 78.0–100.0)
MONOS PCT: 11.8 % (ref 3.0–12.0)
Monocytes Absolute: 0.6 10*3/uL (ref 0.1–1.0)
NEUTROS ABS: 2.8 10*3/uL (ref 1.4–7.7)
NEUTROS PCT: 52.2 % (ref 43.0–77.0)
Platelets: 219 10*3/uL (ref 150.0–400.0)
RBC: 5.33 Mil/uL (ref 4.22–5.81)
RDW: 13 % (ref 11.5–15.5)
WBC: 5.3 10*3/uL (ref 4.0–10.5)

## 2014-06-18 LAB — BASIC METABOLIC PANEL
BUN: 12 mg/dL (ref 6–23)
CALCIUM: 9.1 mg/dL (ref 8.4–10.5)
CO2: 25 mEq/L (ref 19–32)
Chloride: 104 mEq/L (ref 96–112)
Creatinine, Ser: 0.7 mg/dL (ref 0.4–1.5)
GFR: 125.23 mL/min (ref >60.00)
Glucose, Bld: 79 mg/dL (ref 70–99)
Potassium: 4.1 mEq/L (ref 3.5–5.1)
SODIUM: 139 meq/L (ref 135–145)

## 2014-06-18 LAB — TSH: TSH: 1.17 u[IU]/mL (ref 0.35–4.50)

## 2014-06-18 LAB — HEPATIC FUNCTION PANEL
ALBUMIN: 4.5 g/dL (ref 3.5–5.2)
ALK PHOS: 59 U/L (ref 39–117)
ALT: 36 U/L (ref 0–53)
AST: 26 U/L (ref 0–37)
BILIRUBIN DIRECT: 0.2 mg/dL (ref 0.0–0.3)
TOTAL PROTEIN: 7.8 g/dL (ref 6.0–8.3)
Total Bilirubin: 0.7 mg/dL (ref 0.2–1.2)

## 2014-06-18 LAB — LIPID PANEL
CHOLESTEROL: 241 mg/dL — AB (ref 0–200)
HDL: 52.5 mg/dL (ref >39.00)
LDL CALC: 167 mg/dL — AB (ref 0–99)
NonHDL: 188.5
Total CHOL/HDL Ratio: 5
Triglycerides: 106 mg/dL (ref 0.0–149.0)
VLDL: 21.2 mg/dL (ref 0.0–40.0)

## 2014-06-18 LAB — PSA: PSA: 0.74 ng/mL (ref 0.10–4.00)

## 2014-06-23 ENCOUNTER — Encounter: Payer: Self-pay | Admitting: Family Medicine

## 2014-06-23 ENCOUNTER — Ambulatory Visit (INDEPENDENT_AMBULATORY_CARE_PROVIDER_SITE_OTHER): Payer: No Typology Code available for payment source | Admitting: Family Medicine

## 2014-06-23 VITALS — BP 116/70 | HR 67 | Temp 97.8°F | Ht 74.0 in | Wt 239.0 lb

## 2014-06-23 DIAGNOSIS — Z23 Encounter for immunization: Secondary | ICD-10-CM

## 2014-06-23 DIAGNOSIS — Z0000 Encounter for general adult medical examination without abnormal findings: Secondary | ICD-10-CM

## 2014-06-23 NOTE — Patient Instructions (Signed)
Fat and Cholesterol Control Diet Fat and cholesterol levels in your blood and organs are influenced by your diet. High levels of fat and cholesterol may lead to diseases of the heart, small and large blood vessels, gallbladder, liver, and pancreas. CONTROLLING FAT AND CHOLESTEROL WITH DIET Although exercise and lifestyle factors are important, your diet is key. That is because certain foods are known to raise cholesterol and others to lower it. The goal is to balance foods for their effect on cholesterol and more importantly, to replace saturated and trans fat with other types of fat, such as monounsaturated fat, polyunsaturated fat, and omega-3 fatty acids. On average, a person should consume no more than 15 to 17 g of saturated fat daily. Saturated and trans fats are considered "bad" fats, and they will raise LDL cholesterol. Saturated fats are primarily found in animal products such as meats, butter, and cream. However, that does not mean you need to give up all your favorite foods. Today, there are good tasting, low-fat, low-cholesterol substitutes for most of the things you like to eat. Choose low-fat or nonfat alternatives. Choose round or loin cuts of red meat. These types of cuts are lowest in fat and cholesterol. Chicken (without the skin), fish, veal, and ground turkey breast are great choices. Eliminate fatty meats, such as hot dogs and salami. Even shellfish have little or no saturated fat. Have a 3 oz (85 g) portion when you eat lean meat, poultry, or fish. Trans fats are also called "partially hydrogenated oils." They are oils that have been scientifically manipulated so that they are solid at room temperature resulting in a longer shelf life and improved taste and texture of foods in which they are added. Trans fats are found in stick margarine, some tub margarines, cookies, crackers, and baked goods.  When baking and cooking, oils are a great substitute for butter. The monounsaturated oils are  especially beneficial since it is believed they lower LDL and raise HDL. The oils you should avoid entirely are saturated tropical oils, such as coconut and palm.  Remember to eat a lot from food groups that are naturally free of saturated and trans fat, including fish, fruit, vegetables, beans, grains (barley, rice, couscous, bulgur wheat), and pasta (without cream sauces).  IDENTIFYING FOODS THAT LOWER FAT AND CHOLESTEROL  Soluble fiber may lower your cholesterol. This type of fiber is found in fruits such as apples, vegetables such as broccoli, potatoes, and carrots, legumes such as beans, peas, and lentils, and grains such as barley. Foods fortified with plant sterols (phytosterol) may also lower cholesterol. You should eat at least 2 g per day of these foods for a cholesterol lowering effect.  Read package labels to identify low-saturated fats, trans fat free, and low-fat foods at the supermarket. Select cheeses that have only 2 to 3 g saturated fat per ounce. Use a heart-healthy tub margarine that is free of trans fats or partially hydrogenated oil. When buying baked goods (cookies, crackers), avoid partially hydrogenated oils. Breads and muffins should be made from whole grains (whole-wheat or whole oat flour, instead of "flour" or "enriched flour"). Buy non-creamy canned soups with reduced salt and no added fats.  FOOD PREPARATION TECHNIQUES  Never deep-fry. If you must fry, either stir-fry, which uses very little fat, or use non-stick cooking sprays. When possible, broil, bake, or roast meats, and steam vegetables. Instead of putting butter or margarine on vegetables, use lemon and herbs, applesauce, and cinnamon (for squash and sweet potatoes). Use nonfat   yogurt, salsa, and low-fat dressings for salads.  LOW-SATURATED FAT / LOW-FAT FOOD SUBSTITUTES Meats / Saturated Fat (g)  Avoid: Steak, marbled (3 oz/85 g) / 11 g  Choose: Steak, lean (3 oz/85 g) / 4 g  Avoid: Hamburger (3 oz/85 g) / 7  g  Choose: Hamburger, lean (3 oz/85 g) / 5 g  Avoid: Ham (3 oz/85 g) / 6 g  Choose: Ham, lean cut (3 oz/85 g) / 2.4 g  Avoid: Chicken, with skin, dark meat (3 oz/85 g) / 4 g  Choose: Chicken, skin removed, dark meat (3 oz/85 g) / 2 g  Avoid: Chicken, with skin, light meat (3 oz/85 g) / 2.5 g  Choose: Chicken, skin removed, light meat (3 oz/85 g) / 1 g Dairy / Saturated Fat (g)  Avoid: Whole milk (1 cup) / 5 g  Choose: Low-fat milk, 2% (1 cup) / 3 g  Choose: Low-fat milk, 1% (1 cup) / 1.5 g  Choose: Skim milk (1 cup) / 0.3 g  Avoid: Hard cheese (1 oz/28 g) / 6 g  Choose: Skim milk cheese (1 oz/28 g) / 2 to 3 g  Avoid: Cottage cheese, 4% fat (1 cup) / 6.5 g  Choose: Low-fat cottage cheese, 1% fat (1 cup) / 1.5 g  Avoid: Ice cream (1 cup) / 9 g  Choose: Sherbet (1 cup) / 2.5 g  Choose: Nonfat frozen yogurt (1 cup) / 0.3 g  Choose: Frozen fruit bar / trace  Avoid: Whipped cream (1 tbs) / 3.5 g  Choose: Nondairy whipped topping (1 tbs) / 1 g Condiments / Saturated Fat (g)  Avoid: Mayonnaise (1 tbs) / 2 g  Choose: Low-fat mayonnaise (1 tbs) / 1 g  Avoid: Butter (1 tbs) / 7 g  Choose: Extra light margarine (1 tbs) / 1 g  Avoid: Coconut oil (1 tbs) / 11.8 g  Choose: Olive oil (1 tbs) / 1.8 g  Choose: Corn oil (1 tbs) / 1.7 g  Choose: Safflower oil (1 tbs) / 1.2 g  Choose: Sunflower oil (1 tbs) / 1.4 g  Choose: Soybean oil (1 tbs) / 2.4 g  Choose: Canola oil (1 tbs) / 1 g Document Released: 08/29/2005 Document Revised: 12/24/2012 Document Reviewed: 11/27/2013 Exit[employer] Patient Information 2015 [employer]. This information is not intended to replace advice given to you by your health care provider. Make sure you discuss any questions you have with your health care provider. Cholesterol Cholesterol is a white, waxy, fat-like substance needed by your body in small amounts. The liver makes all the cholesterol you need. Cholesterol is carried from the liver  by the blood through the blood vessels. Deposits of cholesterol (plaque) may build up on blood vessel walls. These make the arteries narrower and stiffer. Cholesterol plaques increase the risk for heart attack and stroke.  You cannot feel your cholesterol level even if it is very high. The only way to know it is high is with a blood test. Once you know your cholesterol levels, you should keep a record of the test results. Work with your health care provider to keep your levels in the desired range.  WHAT DO THE RESULTS MEAN?  Total cholesterol is a rough measure of all the cholesterol in your blood.   LDL is the so-called bad cholesterol. This is the type that deposits cholesterol in the walls of the arteries. You want this level to be low.   HDL is the good cholesterol because it cleans the arteries   and carries the LDL away. You want this level to be high.  Triglycerides are fat that the body can either burn for energy or store. High levels are closely linked to heart disease.  WHAT ARE THE DESIRED LEVELS OF CHOLESTEROL?  Total cholesterol below 200.   LDL below 100 for people at risk, below 70 for those at very high risk.   HDL above 50 is good, above 60 is best.   Triglycerides below 150.  HOW CAN I LOWER MY CHOLESTEROL?  Diet. Follow your diet programs as directed by your health care provider.   Choose fish or white meat chicken and turkey, roasted or baked. Limit fatty cuts of red meat, fried foods, and processed meats, such as sausage and lunch meats.   Eat lots of fresh fruits and vegetables.  Choose whole grains, beans, pasta, potatoes, and cereals.   Use only small amounts of olive, corn, or canola oils.   Avoid butter, mayonnaise, shortening, or palm kernel oils.  Avoid foods with trans fats.   Drink skim or nonfat milk and eat low-fat or nonfat yogurt and cheeses. Avoid whole milk, cream, ice cream, egg yolks, and full-fat cheeses.   Healthy desserts  include angel food cake, ginger snaps, animal crackers, hard candy, popsicles, and low-fat or nonfat frozen yogurt. Avoid pastries, cakes, pies, and cookies.   Exercise. Follow your exercise programs as directed by your health care provider.   A regular program helps decrease LDL and raise HDL.   A regular program helps with weight control.   Do things that increase your activity level like gardening, walking, or taking the stairs. Ask your health care provider about how you can be more active in your daily life.   Medicine. Take medicine only as directed by your health care provider.   Medicine may be prescribed by your health care provider to help lower cholesterol and decrease the risk for heart disease.   If you have several risk factors, you may need medicine even if your levels are normal. Document Released: 05/24/2001 Document Revised: 01/13/2014 Document Reviewed: 06/12/2013 ExitCare Patient Information 2015 [employer]. This information is not intended to replace advice given to you by your health care provider. Make sure you discuss any questions you have with your health care provider.  

## 2014-06-23 NOTE — Progress Notes (Signed)
   Subjective:    Patient ID: Dean Lewis, male    DOB: 03-22-1961, 53 y.o.   MRN: [id number]  HPI Patient here for complete physical. Generally very healthy. History of mild elevated cholesterol. Takes no regular medications. He had colonoscopy year and one half ago with recommended five-year followup. No consistent exercise. No recent chest pains but sometimes feels more winded with exercise which he attributes to deconditioning. Needs flu vaccine. Tetanus up-to-date.  Past Medical History  Diagnosis Date  . Seasonal allergies    Past Surgical History  Procedure Laterality Date  . Pilonidal cyst excision    . Septoplasty    . Knee arthroscopy  1991    R knee    reports that he has never smoked. He does not have any smokeless tobacco history on file. He reports that he drinks alcohol. He reports that he does not use illicit drugs. family history includes Breast cancer in his mother; Cancer in his mother; Diabetes in his maternal grandmother; Hyperlipidemia in his father; Stroke (age of onset: 2) in his father. No Known Allergies    Review of Systems  Constitutional: Negative for fever, activity change, appetite change and fatigue.  HENT: Negative for congestion, ear pain and trouble swallowing.   Eyes: Negative for pain and visual disturbance.  Respiratory: Negative for cough, shortness of breath and wheezing.   Cardiovascular: Negative for chest pain and palpitations.  Gastrointestinal: Negative for nausea, vomiting, abdominal pain, diarrhea, constipation, blood in stool, abdominal distention and rectal pain.  Genitourinary: Negative for dysuria, hematuria and testicular pain.  Musculoskeletal: Negative for arthralgias and joint swelling.  Skin: Negative for rash.  Neurological: Negative for dizziness, syncope and headaches.  Hematological: Negative for adenopathy.  Psychiatric/Behavioral: Negative for confusion and dysphoric mood.       Objective:   Physical Exam    Constitutional: He is oriented to person, place, and time. He appears well-developed and well-nourished. No distress.  HENT:  Head: Normocephalic and atraumatic.  Right Ear: External ear normal.  Left Ear: External ear normal.  Mouth/Throat: Oropharynx is clear and moist.  Eyes: Conjunctivae and EOM are normal. Pupils are equal, round, and reactive to light.  Neck: Normal range of motion. Neck supple. No thyromegaly present.  Cardiovascular: Normal rate, regular rhythm and normal heart sounds.   No murmur heard. Pulmonary/Chest: No respiratory distress. He has no wheezes. He has no rales.  Abdominal: Soft. Bowel sounds are normal. He exhibits no distension and no mass. There is no tenderness. There is no rebound and no guarding.  Musculoskeletal: He exhibits no edema.  Lymphadenopathy:    He has no cervical adenopathy.  Neurological: He is alert and oriented to person, place, and time. He displays normal reflexes. No cranial nerve deficit.  Skin: No rash noted.  Psychiatric: He has a normal mood and affect.          Assessment & Plan:  Complete physical. Flu vaccine given. Tetanus up-to-date. We discussed his hyperlipidemia. 6% 10 year risk of CAD. He prefers diet and lifestyle modification and recheck in 1 year. Reduce saturated fats and trans-fats. Increase soluble fiber. Establish more consistent exercise

## 2014-06-23 NOTE — Progress Notes (Signed)
Pre visit review using our clinic review tool, if applicable. No additional management support is needed unless otherwise documented below in the visit note. 

## 2015-09-13 HISTORY — PX: OTHER SURGICAL HISTORY: SHX169

## 2016-05-04 ENCOUNTER — Encounter (HOSPITAL_BASED_OUTPATIENT_CLINIC_OR_DEPARTMENT_OTHER): Payer: Self-pay | Admitting: *Deleted

## 2016-05-05 ENCOUNTER — Encounter (HOSPITAL_BASED_OUTPATIENT_CLINIC_OR_DEPARTMENT_OTHER): Payer: Self-pay | Admitting: Physician Assistant

## 2016-05-05 DIAGNOSIS — S86012A Strain of left Achilles tendon, initial encounter: Secondary | ICD-10-CM

## 2016-05-05 HISTORY — DX: Strain of left Achilles tendon, initial encounter: S86.012A

## 2016-05-05 NOTE — H&P (Signed)
Dean Lewis is an 55 y.o. male.   Chief Complaint: left ankle pain HPI: Patient injured his left achilles 1 week ago playing softball    MRI confirmed achilles tear.  Past Medical History:  Diagnosis Date  . Achilles tendon rupture    left  . Seasonal allergies   . Traumatic rupture of left Achilles tendon 05/05/2016    Past Surgical History:  Procedure Laterality Date  . KNEE ARTHROSCOPY  1991   R knee  . PILONIDAL CYST EXCISION    . SEPTOPLASTY      Family History  Problem Relation Age of Onset  . Cancer Mother     beast ca  . Breast cancer Mother   . Stroke Father 18    AV malformation  . Hyperlipidemia Father   . Diabetes Maternal Grandmother    Social History:  reports that he has never smoked. He has quit using smokeless tobacco. He reports that he drinks alcohol. He reports that he does not use drugs.  Allergies: No Known Allergies  No current facility-administered medications for this encounter.   Current Outpatient Prescriptions:  .  cetirizine (ZYRTEC) 10 MG tablet, Take 10 mg by mouth daily.  , Disp: , Rfl:   No results found for this or any previous visit (from the past 48 hour(s)). No results found.  Review of Systems  Constitutional: Negative.   HENT: Negative.   Eyes: Negative.   Respiratory: Negative.   Cardiovascular: Negative.   Gastrointestinal: Negative.   Genitourinary: Negative.   Musculoskeletal: Positive for joint pain.  Skin: Negative.   Neurological: Negative.   Endo/Heme/Allergies: Negative.   Psychiatric/Behavioral: Negative.     Height 6\' 2"  (1.88 m), weight 106.6 kg (235 lb). Physical Exam  Constitutional: He is oriented to person, place, and time. He appears well-developed and well-nourished.  HENT:  Head: Normocephalic and atraumatic.  Mouth/Throat: Oropharynx is clear and moist.  Eyes: Conjunctivae are normal. Pupils are equal, round, and reactive to light.  Neck: Neck supple.  Cardiovascular: Normal rate.    Respiratory: Effort normal.  GI: Soft.  Musculoskeletal:  Left ankle palpable defect in his achilles.  Small abrasion healing.  Moderate swelling 2+ DP pulse   Neurological: He is alert and oriented to person, place, and time.  Skin: Skin is dry.  Psychiatric: He has a normal mood and affect.     Assessment Principal Problem:   Traumatic rupture of left Achilles tendon    Plan Left achilles tendon repair.  The risks, benefits, and possible complications of the procedure were discussed in detail with the patient.  The patient is without question.  Linda Hedges, PA-C 05/05/2016, 12:24 PM

## 2016-05-09 ENCOUNTER — Ambulatory Visit (HOSPITAL_BASED_OUTPATIENT_CLINIC_OR_DEPARTMENT_OTHER)
Admission: RE | Admit: 2016-05-09 | Payer: Managed Care, Other (non HMO) | Source: Ambulatory Visit | Admitting: Orthopedic Surgery

## 2016-05-09 HISTORY — DX: Strain of left Achilles tendon, initial encounter: S86.012A

## 2016-05-09 HISTORY — DX: Strain of unspecified achilles tendon, initial encounter: S86.019A

## 2016-05-09 SURGERY — REPAIR, TENDON, ACHILLES
Anesthesia: General | Laterality: Left

## 2016-09-22 DIAGNOSIS — M66362 Spontaneous rupture of flexor tendons, left lower leg: Secondary | ICD-10-CM | POA: Diagnosis not present

## 2016-10-19 ENCOUNTER — Telehealth: Payer: Self-pay | Admitting: Family Medicine

## 2016-10-19 NOTE — Telephone Encounter (Signed)
Patient would like to transfer care to [facility] due to location. Out of courtesy for Dr. Elease Hashimoto I will await approval from both of you before scheduling appointment to establish care.   Thank you.

## 2016-10-19 NOTE — Telephone Encounter (Signed)
That will be fine. Thanks 

## 2016-10-19 NOTE — Telephone Encounter (Signed)
That would be great.

## 2016-10-21 ENCOUNTER — Encounter: Payer: Self-pay | Admitting: Family Medicine

## 2016-10-21 ENCOUNTER — Ambulatory Visit (INDEPENDENT_AMBULATORY_CARE_PROVIDER_SITE_OTHER): Payer: 59 | Admitting: Family Medicine

## 2016-10-21 VITALS — BP 130/80 | HR 85 | Temp 98.1°F | Ht 73.0 in | Wt 250.0 lb

## 2016-10-21 DIAGNOSIS — B078 Other viral warts: Secondary | ICD-10-CM

## 2016-10-21 DIAGNOSIS — Z6832 Body mass index (BMI) 32.0-32.9, adult: Secondary | ICD-10-CM | POA: Diagnosis not present

## 2016-10-21 DIAGNOSIS — Z0000 Encounter for general adult medical examination without abnormal findings: Secondary | ICD-10-CM

## 2016-10-21 DIAGNOSIS — Z23 Encounter for immunization: Secondary | ICD-10-CM

## 2016-10-21 LAB — COMPREHENSIVE METABOLIC PANEL
ALT: 41 U/L (ref 0–53)
AST: 24 U/L (ref 0–37)
Albumin: 4.5 g/dL (ref 3.5–5.2)
Alkaline Phosphatase: 59 U/L (ref 39–117)
BUN: 14 mg/dL (ref 6–23)
CALCIUM: 9.2 mg/dL (ref 8.4–10.5)
CHLORIDE: 107 meq/L (ref 96–112)
CO2: 28 meq/L (ref 19–32)
CREATININE: 0.83 mg/dL (ref 0.40–1.50)
GFR: 101.98 mL/min (ref >60.00)
Glucose, Bld: 97 mg/dL (ref 70–99)
Potassium: 3.9 mEq/L (ref 3.5–5.1)
Sodium: 139 mEq/L (ref 135–145)
Total Bilirubin: 0.4 mg/dL (ref 0.2–1.2)
Total Protein: 7.4 g/dL (ref 6.0–8.3)

## 2016-10-21 LAB — LIPID PANEL
CHOL/HDL RATIO: 4
Cholesterol: 210 mg/dL — ABNORMAL HIGH (ref 0–200)
HDL: 52 mg/dL (ref >39.00)
LDL CALC: 144 mg/dL — AB (ref 0–99)
NonHDL: 158.47
TRIGLYCERIDES: 73 mg/dL (ref 0.0–149.0)
VLDL: 14.6 mg/dL (ref 0.0–40.0)

## 2016-10-21 LAB — HEMOGLOBIN A1C: Hgb A1c MFr Bld: 5.9 % (ref 4.6–6.5)

## 2016-10-21 NOTE — Progress Notes (Signed)
Pre visit review using our clinic review tool, if applicable. No additional management support is needed unless otherwise documented below in the visit note. 

## 2016-10-21 NOTE — Progress Notes (Signed)
Subjective:    Patient ID: Dean Lewis, male    DOB: Mar 03, 1961, 56 y.o.   MRN: [id number]  HPI This is a 56 yo male who presents today to establish care. Has been a patient of Dr. Tollie Pizza and is establishing care here due to location. Feels that he is very healthy. Parents still alive and are healthy. He is married with two daughters. His 54 yo lives at home.   Last CPE- 06/2014 PSA- 06/2014 Colonoscopy- 01/18/2012- 5 year recall Td- 01/12/2008 Flu- doesn't always get, will get today Dental- regular Eye-once a year Exercise- elliptical Diet- eats mostly at home, shared cooking and shopping, drinks 3-5 cups of coffee every morning with smoothie (veggies, frozen fruit, protein powder, greek yogurt), left overs for lunch, meat and two vegetables for dinner. Eats out 2/x per week, enjoys burgers, wings. Struggles with portion control, always finishes his food. Drinks 3 beers daily and more on weekend (Michelob ultra).   Past Medical History:  Diagnosis Date  . Achilles tendon rupture    left  . Seasonal allergies   . Traumatic rupture of left Achilles tendon 05/05/2016   Past Surgical History:  Procedure Laterality Date  . ceptoplasty Bilateral 1991  . KNEE ARTHROSCOPY  1991   R knee  . left achilles repair  Left 2017  . PILONIDAL CYST EXCISION    . SEPTOPLASTY     Family History  Problem Relation Age of Onset  . Cancer Mother     beast ca  . Breast cancer Mother   . Arthritis Mother   . Stroke Father 23    AV malformation  . Hyperlipidemia Father   . Diabetes Maternal Grandmother    Social History  Substance Use Topics  . Smoking status: Never Smoker  . Smokeless tobacco: Never Used  . Alcohol use Yes      Review of Systems  Constitutional: Positive for activity change (decreased with achilles rupture and surgery, hoping to be cleared for full use later this month.). Negative for fatigue.  Respiratory: Negative for chest tightness and shortness of breath.     Cardiovascular: Negative for chest pain and leg swelling.  Gastrointestinal: Negative for abdominal pain, constipation and diarrhea.  Genitourinary: Negative for decreased urine volume and difficulty urinating.  Musculoskeletal:       Occasional pain and tightness of left achilles.   Skin: Positive for rash (chronic warts on palms of hands, would like derm referral).  Allergic/Immunologic: Positive for environmental allergies (well controlled with zyrtec).  Neurological: Negative for headaches.  Psychiatric/Behavioral: Negative for dysphoric mood and sleep disturbance.       Objective:   Physical Exam  Constitutional: He is oriented to person, place, and time. He appears well-developed and well-nourished.  HENT:  Head: Normocephalic and atraumatic.  Right Ear: External ear normal.  Left Ear: External ear normal.  Nose: Nose normal.  Mouth/Throat: Oropharynx is clear and moist. No oropharyngeal exudate.  Eyes: Conjunctivae are normal. Right eye exhibits no discharge. Left eye exhibits no discharge.  Neck: Normal range of motion. Neck supple.  Cardiovascular: Normal rate, regular rhythm and normal heart sounds.   Pulmonary/Chest: Effort normal and breath sounds normal.  Abdominal: Soft. Bowel sounds are normal. He exhibits no distension. There is no tenderness. There is no rebound and no guarding. Hernia confirmed negative in the right inguinal area and confirmed negative in the left inguinal area.  Genitourinary: Testes normal and penis normal. Circumcised.  Musculoskeletal: Normal range of motion. He exhibits  no edema.  Lymphadenopathy:    He has no cervical adenopathy.       Right: No inguinal adenopathy present.       Left: No inguinal adenopathy present.  Neurological: He is alert and oriented to person, place, and time. He has normal reflexes.  Skin: Skin is warm and dry.  Bilateral palms of hands with multiple common warts.   Psychiatric: He has a normal mood and affect. His  behavior is normal. Judgment and thought content normal.  Vitals reviewed.     BP 130/80 (BP Location: Left Arm, Patient Position: Sitting, Cuff Size: Normal)   Pulse 85   Temp 98.1 F (36.7 C) (Oral)   Ht 6\' 1"  (1.854 m)   Wt 250 lb (113.4 kg)   SpO2 94%   BMI 32.98 kg/m  Wt Readings from Last 3 Encounters:  10/21/16 250 lb (113.4 kg)  06/23/14 239 lb (108.4 kg)  06/14/13 239 lb (108.4 kg)   Depression screen PHQ 2/9 10/21/2016  Decreased Interest 0  Down, Depressed, Hopeless 0  PHQ - 2 Score 0       Assessment & Plan:  1. Annual physical exam - Discussed and encouraged healthy lifestyle choices- adequate sleep, regular exercise, stress management and healthy food choices.   2. Flu vaccine need - Flu Vaccine QUAD 36+ mos IM  3. BMI 32.0-32.9,adult - provided information regarding mediterranean diet, encouraged decreased portions, discussed beer consumption and encouraged him to limit alcoholic beverages to 2 per day - increase exercise once cleared from achilles surgery - Comprehensive metabolic panel - Lipid panel - Hemoglobin A1c  4. Need for influenza vaccination - given today  5. Other viral warts - given severity and duration, will refer to dermatology - Ambulatory referral to Dermatology   Clarene Reamer, FNP-BC  [facility] [place]  10/21/2016 1:14 PM

## 2016-10-21 NOTE — Patient Instructions (Signed)
Keeping you healthy  Get these tests  Blood pressure- Have your blood pressure checked once a year by your healthcare provider.  Normal blood pressure is 120/80  Weight- Have your body mass index (BMI) calculated to screen for obesity.  BMI is a measure of body fat based on height and weight. You can also calculate your own BMI at ViewBanking.si.  Cholesterol- Have your cholesterol checked every year.  Diabetes- Have your blood sugar checked regularly if you have high blood pressure, high cholesterol, have a family history of diabetes or if you are overweight.  Screening for Colon Cancer- Colonoscopy starting at age 77.  Screening may begin sooner depending on your family history and other health conditions. Follow up colonoscopy as directed by your Gastroenterologist.  Screening for Prostate Cancer- Both blood work (PSA) and a rectal exam help screen for Prostate Cancer.  Screening begins at age 79 with African-American men and at age 75 with Caucasian men.  Screening may begin sooner depending on your family history.  Take these medicines  Aspirin- One aspirin daily can help prevent Heart disease and Stroke.  Flu shot- Every fall.  Tetanus- Every 10 years.  Zostavax- Once after the age of 79 to prevent Shingles.  Pneumonia shot- Once after the age of 38; if you are younger than 3, ask your healthcare provider if you need a Pneumonia shot.  Take these steps  Don't smoke- If you do smoke, talk to your doctor about quitting.  For tips on how to quit, go to www.smokefree.gov or call 1-800-QUIT-NOW.  Be physically active- Exercise 5 days a week for at least 30 minutes.  If you are not already physically active start slow and gradually work up to 30 minutes of moderate physical activity.  Examples of moderate activity include walking briskly, mowing the yard, dancing, swimming, bicycling, etc.  Eat a healthy diet- Eat a variety of healthy food such as fruits, vegetables, low  fat milk, low fat cheese, yogurt, lean meant, poultry, fish, beans, tofu, etc. For more information go to www.thenutritionsource.org  Drink alcohol in moderation- Limit alcohol intake to less than two drinks a day. Never drink and drive.  Dentist- Brush and floss twice daily; visit your dentist twice a year.  Depression- Your emotional health is as important as your physical health. If you're feeling down, or losing interest in things you would normally enjoy please talk to your healthcare provider.  Eye exam- Visit your eye doctor every year.  Safe sex- If you may be exposed to a sexually transmitted infection, use a condom.  Seat belts- Seat belts can save your life; always wear one.  Smoke/Carbon Monoxide detectors- These detectors need to be installed on the appropriate level of your home.  Replace batteries at least once a year.  Skin cancer- When out in the sun, cover up and use sunscreen 15 SPF or higher.  Violence- If anyone is threatening you, please tell your healthcare provider. Living Will/ Health care power of attorney- Speak with your healthcare provider and family.Mediterranean Diet A Mediterranean diet refers to food and lifestyle choices that are based on the traditions of countries located on the [employer]. This way of eating has been shown to help prevent certain conditions and improve outcomes for people who have chronic diseases, like kidney disease and heart disease. What are tips for following this plan? Lifestyle  Cook and eat meals together with your family, when possible.  Drink enough fluid to keep your urine clear or pale  yellow.  Be physically active every day. This includes:  Aerobic exercise like running or swimming.  Leisure activities like gardening, walking, or housework.  Get 7-8 hours of sleep each night.  If recommended by your health care provider, drink red wine in moderation. This means 1 glass a day for nonpregnant women and 2  glasses a day for men. A glass of wine equals 5 oz (150 mL). Reading food labels  Check the serving size of packaged foods. For foods such as rice and pasta, the serving size refers to the amount of cooked product, not dry.  Check the total fat in packaged foods. Avoid foods that have saturated fat or trans fats.  Check the ingredients list for added sugars, such as corn syrup. Shopping  At the grocery store, buy most of your food from the areas near the walls of the store. This includes:  Fresh fruits and vegetables (produce).  Grains, beans, nuts, and seeds. Some of these may be available in unpackaged forms or large amounts (in bulk).  Fresh seafood.  Poultry and eggs.  Low-fat dairy products.  Buy whole ingredients instead of prepackaged foods.  Buy fresh fruits and vegetables in-season from local farmers markets.  Buy frozen fruits and vegetables in resealable bags.  If you do not have access to quality fresh seafood, buy precooked frozen shrimp or canned fish, such as tuna, salmon, or sardines.  Buy small amounts of raw or cooked vegetables, salads, or olives from the deli or salad bar at your store.  Stock your pantry so you always have certain foods on hand, such as olive oil, canned tuna, canned tomatoes, rice, pasta, and beans. Cooking  Cook foods with extra-virgin olive oil instead of using butter or other vegetable oils.  Have meat as a side dish, and have vegetables or grains as your main dish. This means having meat in small portions or adding small amounts of meat to foods like pasta or stew.  Use beans or vegetables instead of meat in common dishes like chili or lasagna.  Experiment with different cooking methods. Try roasting or broiling vegetables instead of steaming or sauteing them.  Add frozen vegetables to soups, stews, pasta, or rice.  Add nuts or seeds for added healthy fat at each meal. You can add these to yogurt, salads, or vegetable  dishes.  Marinate fish or vegetables using olive oil, lemon juice, garlic, and fresh herbs. Meal planning  Plan to eat 1 vegetarian meal one day each week. Try to work up to 2 vegetarian meals, if possible.  Eat seafood 2 or more times a week.  Have healthy snacks readily available, such as:  Vegetable sticks with hummus.  Greek yogurt.  Fruit and nut trail mix.  Eat balanced meals throughout the week. This includes:  Fruit: 2-3 servings a day  Vegetables: 4-5 servings a day  Low-fat dairy: 2 servings a day  Fish, poultry, or lean meat: 1 serving a day  Beans and legumes: 2 or more servings a week  Nuts and seeds: 1-2 servings a day  Whole grains: 6-8 servings a day  Extra-virgin olive oil: 3-4 servings a day  Limit red meat and sweets to only a few servings a month What are my food choices?  Mediterranean diet  Recommended  Grains: Whole-grain pasta. Brown rice. Bulgar wheat. Polenta. Couscous. Whole-wheat bread. [place].  Vegetables: Artichokes. Beets. Broccoli. Cabbage. Carrots. Eggplant. Green beans. Chard. Kale. Spinach. Onions. Leeks. Peas. Squash. Tomatoes. Peppers. Radishes.  Fruits:  Apples. Apricots. Avocado. Berries. Bananas. Cherries. Dates. Figs. Grapes. Lemons. Melon. Oranges. Peaches. Plums. Pomegranate.  Meats and other protein foods: Beans. Almonds. Sunflower seeds. Pine nuts. Peanuts. [place]. Salmon. Scallops. Shrimp. Orange. Tilapia. Clams. Oysters. Eggs.  Dairy: Low-fat milk. Cheese. Greek yogurt.  Beverages: Water. Red wine. Herbal tea.  Fats and oils: Extra virgin olive oil. Avocado oil. Grape seed oil.  Sweets and desserts: [place] yogurt with honey. Baked apples. Poached pears. Trail mix.  Seasoning and other foods: Basil. Cilantro. Coriander. Cumin. Mint. Parsley. Sage. Rosemary. Tarragon. Garlic. Oregano. Thyme. Pepper. Balsalmic vinegar. Tahini. Hummus. Tomato sauce. Olives. Mushrooms.  Limit these  Grains: Prepackaged pasta or  rice dishes. Prepackaged cereal with added sugar.  Vegetables: Deep fried potatoes (french fries).  Fruits: Fruit canned in syrup.  Meats and other protein foods: Beef. Pork. Lamb. Poultry with skin. Hot dogs. Berniece Salines.  Dairy: Ice cream. Sour cream. Whole milk.  Beverages: Juice. Sugar-sweetened soft drinks. Beer. Liquor and spirits.  Fats and oils: Butter. Canola oil. Vegetable oil. Beef fat (tallow). Lard.  Sweets and desserts: Cookies. Cakes. Pies. Candy.  Seasoning and other foods: Mayonnaise. Premade sauces and marinades.  The items listed may not be a complete list. Talk with your dietitian about what dietary choices are right for you. Summary  The Mediterranean diet includes both food and lifestyle choices.  Eat a variety of fresh fruits and vegetables, beans, nuts, seeds, and whole grains.  Limit the amount of red meat and sweets that you eat.  Talk with your health care provider about whether it is safe for you to drink red wine in moderation. This means 1 glass a day for nonpregnant women and 2 glasses a day for men. A glass of wine equals 5 oz (150 mL). This information is not intended to replace advice given to you by your health care provider. Make sure you discuss any questions you have with your health care provider. Document Released: 04/21/2016 Document Revised: 05/24/2016 Document Reviewed: 04/21/2016 Elsevier Interactive Patient Education  2017 [employer].

## 2016-10-27 DIAGNOSIS — B079 Viral wart, unspecified: Secondary | ICD-10-CM | POA: Diagnosis not present

## 2016-11-17 DIAGNOSIS — M66362 Spontaneous rupture of flexor tendons, left lower leg: Secondary | ICD-10-CM | POA: Diagnosis not present

## 2016-12-01 DIAGNOSIS — B079 Viral wart, unspecified: Secondary | ICD-10-CM | POA: Diagnosis not present

## 2016-12-08 ENCOUNTER — Encounter: Payer: Self-pay | Admitting: Gastroenterology

## 2017-01-13 DIAGNOSIS — B078 Other viral warts: Secondary | ICD-10-CM | POA: Diagnosis not present

## 2017-02-27 DIAGNOSIS — B078 Other viral warts: Secondary | ICD-10-CM | POA: Diagnosis not present

## 2017-04-03 ENCOUNTER — Telehealth: Payer: Self-pay | Admitting: Family Medicine

## 2017-04-03 DIAGNOSIS — B078 Other viral warts: Secondary | ICD-10-CM | POA: Diagnosis not present

## 2017-04-03 NOTE — Telephone Encounter (Signed)
Paperwork:Physical form   Paperwork received by Trecia Rogers requesting form]:  patient   Individual made aware of 3-5 business day turn around (Y/N): yes (call patient when ready)  Office form(s) completed and placed with paperwork (Y/N):   Form location: In Gessner's folder in front office

## 2017-04-03 NOTE — Telephone Encounter (Signed)
Noted  

## 2017-04-04 NOTE — Telephone Encounter (Signed)
Received and completed paper work. Called and left voicemail informing pt that paper work has been complete and placed up front  for pick up. Copy sent to scan nothing further needed.

## 2017-05-09 ENCOUNTER — Encounter: Payer: Self-pay | Admitting: Family Medicine

## 2017-05-09 ENCOUNTER — Ambulatory Visit (INDEPENDENT_AMBULATORY_CARE_PROVIDER_SITE_OTHER): Payer: 59 | Admitting: Family Medicine

## 2017-05-09 VITALS — BP 110/80 | HR 88 | Ht 74.0 in | Wt 251.4 lb

## 2017-05-09 DIAGNOSIS — L237 Allergic contact dermatitis due to plants, except food: Secondary | ICD-10-CM

## 2017-05-09 MED ORDER — PREDNISONE 10 MG PO TABS: ORAL_TABLET | ORAL | 0 refills | Status: AC

## 2017-05-09 NOTE — Progress Notes (Signed)
   Subjective:  Dean Lewis is a 56 y.o. male who presents today with a chief complaint of rash.   HPI:  Rash, Acute Problem Started 2 days ago after doing yard work. Has worsened over that time. Thinks that he got into poison oak. Tried antihistamines and OTC creams which did not help. Located on both arms, abdomen and penis. No SOB. No fevers or chills.    ROS: Per HPI  PMH: Smoking history reviewed. Never smoker.   Objective:  Physical Exam: BP 110/80   Pulse 88   Ht 6\' 2"  (1.88 m)   Wt 251 lb 6.4 oz (114 kg)   SpO2 96%   BMI 32.28 kg/m   Gen: NAD, resting comfortably Skin: Diffuse, excoriated, weeping, erythematous rash involving volar surfaces of upper extremities bilaterally and lower abdomen.   Assessment/Plan:   Poison Plant Dermatitis Given diffuse nature of rash and involvement of genitals will send in Rx for 2 week prednisone taper. Return precautions reviewed. Follow up as needed.   Algis Greenhouse. Jerline Pain, MD 05/09/2017 4:29 PM

## 2017-06-16 DIAGNOSIS — Z23 Encounter for immunization: Secondary | ICD-10-CM | POA: Diagnosis not present

## 2017-10-24 ENCOUNTER — Ambulatory Visit (INDEPENDENT_AMBULATORY_CARE_PROVIDER_SITE_OTHER): Payer: 59 | Admitting: Family Medicine

## 2017-10-24 ENCOUNTER — Encounter: Payer: Self-pay | Admitting: Family Medicine

## 2017-10-24 VITALS — BP 122/82 | HR 69 | Temp 97.9°F | Ht 73.5 in | Wt 242.2 lb

## 2017-10-24 DIAGNOSIS — Z1159 Encounter for screening for other viral diseases: Secondary | ICD-10-CM | POA: Diagnosis not present

## 2017-10-24 DIAGNOSIS — Z13220 Encounter for screening for lipoid disorders: Secondary | ICD-10-CM | POA: Diagnosis not present

## 2017-10-24 DIAGNOSIS — N529 Male erectile dysfunction, unspecified: Secondary | ICD-10-CM | POA: Diagnosis not present

## 2017-10-24 DIAGNOSIS — E669 Obesity, unspecified: Secondary | ICD-10-CM | POA: Diagnosis not present

## 2017-10-24 DIAGNOSIS — Z125 Encounter for screening for malignant neoplasm of prostate: Secondary | ICD-10-CM | POA: Diagnosis not present

## 2017-10-24 DIAGNOSIS — Z114 Encounter for screening for human immunodeficiency virus [HIV]: Secondary | ICD-10-CM

## 2017-10-24 DIAGNOSIS — Z0001 Encounter for general adult medical examination with abnormal findings: Secondary | ICD-10-CM | POA: Diagnosis not present

## 2017-10-24 LAB — COMPREHENSIVE METABOLIC PANEL
ALT: 35 U/L (ref 0–53)
AST: 27 U/L (ref 0–37)
Albumin: 4.5 g/dL (ref 3.5–5.2)
Alkaline Phosphatase: 56 U/L (ref 39–117)
BILIRUBIN TOTAL: 0.6 mg/dL (ref 0.2–1.2)
BUN: 12 mg/dL (ref 6–23)
CALCIUM: 9.4 mg/dL (ref 8.4–10.5)
CHLORIDE: 105 meq/L (ref 96–112)
CO2: 26 meq/L (ref 19–32)
CREATININE: 0.8 mg/dL (ref 0.40–1.50)
GFR: 106.02 mL/min (ref >60.00)
GLUCOSE: 96 mg/dL (ref 70–99)
Potassium: 4.2 mEq/L (ref 3.5–5.1)
SODIUM: 141 meq/L (ref 135–145)
Total Protein: 6.9 g/dL (ref 6.0–8.3)

## 2017-10-24 LAB — CBC WITH DIFFERENTIAL/PLATELET
BASOS ABS: 0.1 10*3/uL (ref 0.0–0.1)
BASOS PCT: 1.8 % (ref 0.0–3.0)
EOS ABS: 0.3 10*3/uL (ref 0.0–0.7)
Eosinophils Relative: 5.9 % — ABNORMAL HIGH (ref 0.0–5.0)
HEMATOCRIT: 47 % (ref 39.0–52.0)
Hemoglobin: 15.6 g/dL (ref 13.0–17.0)
LYMPHS ABS: 1 10*3/uL (ref 0.7–4.0)
LYMPHS PCT: 20.9 % (ref 12.0–46.0)
MCHC: 33.2 g/dL (ref 30.0–36.0)
MCV: 89.7 fl (ref 78.0–100.0)
MONO ABS: 0.6 10*3/uL (ref 0.1–1.0)
Monocytes Relative: 11.2 % (ref 3.0–12.0)
NEUTROS ABS: 3 10*3/uL (ref 1.4–7.7)
NEUTROS PCT: 60.2 % (ref 43.0–77.0)
PLATELETS: 222 10*3/uL (ref 150.0–400.0)
RBC: 5.23 Mil/uL (ref 4.22–5.81)
RDW: 13.5 % (ref 11.5–15.5)
WBC: 5 10*3/uL (ref 4.0–10.5)

## 2017-10-24 LAB — LIPID PANEL
CHOL/HDL RATIO: 4
Cholesterol: 218 mg/dL — ABNORMAL HIGH (ref 0–200)
HDL: 50.5 mg/dL (ref >39.00)
LDL Cholesterol: 148 mg/dL — ABNORMAL HIGH (ref 0–99)
NONHDL: 167.44
TRIGLYCERIDES: 97 mg/dL (ref 0.0–149.0)
VLDL: 19.4 mg/dL (ref 0.0–40.0)

## 2017-10-24 LAB — TESTOSTERONE: TESTOSTERONE: 285.94 ng/dL — AB (ref 300.00–890.00)

## 2017-10-24 LAB — PSA: PSA: 0.54 ng/mL (ref 0.10–4.00)

## 2017-10-24 MED ORDER — SILDENAFIL CITRATE 20 MG PO TABS: 20.0000 mg | ORAL_TABLET | Freq: Every day | ORAL | 3 refills | Status: AC | PRN

## 2017-10-24 NOTE — Progress Notes (Signed)
Subjective:  Dean Lewis is a 57 y.o. male who presents today for his annual comprehensive physical exam.    HPI:  Patient has one complaint today:  1.  Erectile dysfunction.  New problem.  Symptoms have been ongoing for the past several years.  Worsened over last several months.  No obvious precipitating events.  He has difficulty both obtaining and maintaining erection.  Libido is a little bit decreased.  Energy levels are normal.  No dysuria.  No difficulty voiding.  No penile discharge.  Lifestyle Diet: Trying to eat less. No specific diets.  Exercise: Going to the gym everyday.   Depression screen PHQ 2/9 10/24/2017  Decreased Interest 0  Down, Depressed, Hopeless 0  PHQ - 2 Score 0  Altered sleeping -  Tired, decreased energy -  Change in appetite -  Feeling bad or failure about yourself  -  Trouble concentrating -  Moving slowly or fidgety/restless -  Suicidal thoughts -  PHQ-9 Score -  Difficult doing work/chores -    Health Maintenance Due  Topic Date Due  . Hepatitis C Screening  1961-06-30  . HIV Screening  02/19/1976  . COLONOSCOPY  01/17/2017       ROS: Per HPI, otherwise a 10 point review of systems was performed and was negative  PMH:  The following were reviewed and entered/updated in epic: Past Medical History:  Diagnosis Date  . Achilles tendon rupture    left  . Seasonal allergies   . Traumatic rupture of left Achilles tendon 05/05/2016   Patient Active Problem List   Diagnosis Date Noted  . Erectile dysfunction 10/24/2017  . Traumatic rupture of left Achilles tendon 05/05/2016   Past Surgical History:  Procedure Laterality Date  . ceptoplasty Bilateral 1991  . KNEE ARTHROSCOPY  1991   R knee  . left achilles repair  Left 2017  . PILONIDAL CYST EXCISION    . SEPTOPLASTY      Family History  Problem Relation Age of Onset  . Cancer Mother        beast ca  . Breast cancer Mother   . Arthritis Mother   . Stroke Father 50     AV malformation  . Hyperlipidemia Father   . Diabetes Maternal Grandmother     Medications- reviewed and updated Current Outpatient Medications  Medication Sig Dispense Refill  . cetirizine (ZYRTEC) 10 MG tablet Take 10 mg by mouth daily.      . sildenafil (REVATIO) 20 MG tablet Take 1-5 tablets (20-100 mg total) by mouth daily as needed (erectile dysfunction). 90 tablet 3   No current facility-administered medications for this visit.     Allergies-reviewed and updated No Known Allergies  Social History   Socioeconomic History  . Marital status: Married    Spouse name: None  . Number of children: None  . Years of education: None  . Highest education level: None  Social Needs  . Financial resource strain: None  . Food insecurity - worry: None  . Food insecurity - inability: None  . Transportation needs - medical: None  . Transportation needs - non-medical: None  Occupational History  . None  Tobacco Use  . Smoking status: Never Smoker  . Smokeless tobacco: Never Used  Substance and Sexual Activity  . Alcohol use: Yes    Alcohol/week: 1.8 oz    Types: 3 Cans of beer per week    Comment: more on weekends  . Drug use: No  . Sexual  activity: Yes  Other Topics Concern  . None  Social History Narrative   Married with two daughters.   Was in [employer], served on [employer].   Has masters degree. 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    Enjoys mountain biking, hunting, home improvement.    Objective:  Physical Exam: BP 122/82 (BP Location: Left Arm, Patient Position: Sitting, Cuff Size: Large)   Pulse 69   Temp 97.9 F (36.6 C) (Oral)   Ht 6' 1.5" (1.867 m)   Wt 242 lb 4 oz (109.9 kg)   SpO2 95%   BMI 31.53 kg/m   Body mass index is 31.53 kg/m. Wt Readings from Last 3 Encounters:  10/24/17 242 lb 4 oz (109.9 kg)  05/09/17 251 lb 6.4 oz (114 kg)  10/21/16 250 lb (113.4 kg)   Gen: NAD, resting comfortably HEENT: TMs normal bilaterally. OP clear. No  thyromegaly noted.  CV: RRR with no murmurs appreciated Pulm: NWOB, CTAB with no crackles, wheezes, or rhonchi GI: Normal bowel sounds present. Soft, Nontender, Nondistended. MSK: no edema, cyanosis, or clubbing noted Skin: warm, dry Neuro: CN2-12 grossly intact. Strength 5/5 in upper and lower extremities. Reflexes symmetric and intact bilaterally.  Psych: Normal affect and thought content  Assessment/Plan:  Erectile dysfunction Check cardiovascular risk labs today.  Check testosterone level.  Start sildenafil.  Return precautions reviewed.  Preventative Healthcare: Check lipid panel, HIV antibody, hepatitis C antibody today.  Also check PSA.  Patient will be following up with GI for his colonoscopy.  Patient Counseling:  -Nutrition: Stressed importance of moderation in sodium/caffeine intake, saturated fat and cholesterol, caloric balance, sufficient intake of fresh fruits, vegetables, and fiber.  -Stressed the importance of regular exercise.   -Substance Abuse: Discussed cessation/primary prevention of tobacco, alcohol, or other drug use; driving or other dangerous activities under the influence; availability of treatment for abuse.   -Injury prevention: Discussed safety belts, safety helmets, smoke detector, smoking near bedding or upholstery.   -Sexuality: Discussed sexually transmitted diseases, partner selection, use of condoms, avoidance of unintended pregnancy and contraceptive alternatives.   -Dental health: Discussed importance of regular tooth brushing, flossing, and dental visits.  -Health maintenance and immunizations reviewed. Please refer to Health maintenance section.  Return to care in 1 year for next preventative visit.   Algis Greenhouse. Jerline Pain, MD 10/24/2017 9:04 AM

## 2017-10-24 NOTE — Patient Instructions (Signed)

## 2017-10-24 NOTE — Assessment & Plan Note (Signed)
Check cardiovascular risk labs today.  Check testosterone level.  Start sildenafil.  Return precautions reviewed.

## 2017-10-25 LAB — HEPATITIS C ANTIBODY
HEP C AB: NONREACTIVE
SIGNAL TO CUT-OFF: 0.07 (ref <1.00)

## 2017-10-25 LAB — HIV ANTIBODY (ROUTINE TESTING W REFLEX): HIV 1&2 Ab, 4th Generation: NONREACTIVE

## 2018-01-08 ENCOUNTER — Encounter: Payer: Self-pay | Admitting: Gastroenterology

## 2018-01-26 DIAGNOSIS — S32401A Unspecified fracture of right acetabulum, initial encounter for closed fracture: Secondary | ICD-10-CM | POA: Diagnosis not present

## 2018-01-26 DIAGNOSIS — S32471A Displaced fracture of medial wall of right acetabulum, initial encounter for closed fracture: Secondary | ICD-10-CM | POA: Diagnosis not present

## 2018-01-26 DIAGNOSIS — M25551 Pain in right hip: Secondary | ICD-10-CM | POA: Diagnosis not present

## 2018-01-26 DIAGNOSIS — S7291XA Unspecified fracture of right femur, initial encounter for closed fracture: Secondary | ICD-10-CM | POA: Diagnosis not present

## 2018-01-26 DIAGNOSIS — S3289XA Fracture of other parts of pelvis, initial encounter for closed fracture: Secondary | ICD-10-CM | POA: Diagnosis not present

## 2018-01-26 DIAGNOSIS — M25561 Pain in right knee: Secondary | ICD-10-CM | POA: Diagnosis not present

## 2018-01-26 DIAGNOSIS — R079 Chest pain, unspecified: Secondary | ICD-10-CM | POA: Diagnosis not present

## 2018-01-30 ENCOUNTER — Other Ambulatory Visit: Payer: Self-pay | Admitting: Orthopedic Surgery

## 2018-01-30 DIAGNOSIS — M25551 Pain in right hip: Secondary | ICD-10-CM | POA: Diagnosis not present

## 2018-01-30 DIAGNOSIS — T148XXA Other injury of unspecified body region, initial encounter: Secondary | ICD-10-CM

## 2018-02-01 ENCOUNTER — Ambulatory Visit
Admission: RE | Admit: 2018-02-01 | Discharge: 2018-02-01 | Disposition: A | Discharge status: Home / Self Care | Payer: 59 | Source: Ambulatory Visit | Attending: Orthopedic Surgery | Admitting: Orthopedic Surgery

## 2018-02-01 DIAGNOSIS — T148XXA Other injury of unspecified body region, initial encounter: Secondary | ICD-10-CM

## 2018-02-01 DIAGNOSIS — S3282XA Multiple fractures of pelvis without disruption of pelvic ring, initial encounter for closed fracture: Secondary | ICD-10-CM | POA: Diagnosis not present

## 2018-03-06 DIAGNOSIS — M25551 Pain in right hip: Secondary | ICD-10-CM | POA: Diagnosis not present

## 2018-03-13 ENCOUNTER — Telehealth: Payer: Self-pay | Admitting: Family Medicine

## 2018-03-13 NOTE — Telephone Encounter (Signed)
See note

## 2018-03-13 NOTE — Telephone Encounter (Signed)
I have been communicating with this patient via [place].

## 2018-03-13 NOTE — Telephone Encounter (Signed)
Copied from [place] [phone] [phone]. Topic: Quick Communication - Rx Refill/Question >> Mar 13, 2018 11:00 AM Oliver Pila B wrote: Medication: Ambien  Pt is requesting the medication above before he goes on a trip; not on pt's medication list, contact pt to advise

## 2018-03-21 ENCOUNTER — Encounter: Payer: Self-pay | Admitting: Family Medicine

## 2018-03-21 ENCOUNTER — Ambulatory Visit (INDEPENDENT_AMBULATORY_CARE_PROVIDER_SITE_OTHER): Payer: 59 | Admitting: Family Medicine

## 2018-03-21 VITALS — BP 126/78 | HR 72 | Temp 98.1°F | Ht 74.0 in | Wt 235.8 lb

## 2018-03-21 DIAGNOSIS — G4720 Circadian rhythm sleep disorder, unspecified type: Secondary | ICD-10-CM | POA: Insufficient documentation

## 2018-03-21 MED ORDER — ZOLPIDEM TARTRATE 5 MG PO TABS: 5.0000 mg | ORAL_TABLET | Freq: Every evening | ORAL | 0 refills | Status: DC | PRN

## 2018-03-21 NOTE — Patient Instructions (Signed)
It was very nice to see you today!  We will start ambien. Please be careful with this medication. If you have severe side effects, let me know.  If you experience significant jet lag, please take 5-10mg  of melatonin about 30 minutes before you go to sleep.   Take care, Dr Jerline Pain
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
## 2018-03-21 NOTE — Assessment & Plan Note (Signed)
Discussed options with patient.  Discussed Ambien, hydroxyzine, and melatonin.  Discussed potential side effects of Ambien including next-day drowsiness and parasomnias.  Will start low-dose 5 mg as needed.  Also recommended use of 5 to 10 mg of melatonin as needed for jet lag.  Patient said he would only be using the Ambien during the flight.

## 2018-03-29 ENCOUNTER — Encounter: Payer: 59 | Admitting: Gastroenterology

## 2018-04-11 ENCOUNTER — Ambulatory Visit (AMBULATORY_SURGERY_CENTER): Payer: Self-pay | Admitting: *Deleted

## 2018-04-11 VITALS — Ht 74.0 in | Wt 239.0 lb

## 2018-04-11 DIAGNOSIS — Z86010 Personal history of colonic polyps: Secondary | ICD-10-CM

## 2018-04-11 MED ORDER — NA SULFATE-K SULFATE-MG SULF 17.5-3.13-1.6 GM/177ML PO SOLN: 1.0000 | Freq: Once | ORAL | 0 refills | Status: AC

## 2018-04-11 NOTE — Progress Notes (Signed)
No egg or soy allergy known to patient  No issues with past sedation with any surgeries  or procedures, no intubation problems  No diet pills per patient No home 02 use per patient  No blood thinners per patient  Pt denies issues with constipation  No A fib or A flutter  EMMI video sent to pt's e mail - pt declined  $15 Suprep on line coupon to pt in [place] today

## 2018-04-20 ENCOUNTER — Ambulatory Visit (AMBULATORY_SURGERY_CENTER): Payer: 59 | Admitting: Gastroenterology

## 2018-04-20 ENCOUNTER — Encounter: Payer: Self-pay | Admitting: Gastroenterology

## 2018-04-20 VITALS — BP 131/53 | HR 59 | Temp 97.5°F | Resp 21 | Ht 74.0 in | Wt 239.0 lb

## 2018-04-20 DIAGNOSIS — D122 Benign neoplasm of ascending colon: Secondary | ICD-10-CM

## 2018-04-20 DIAGNOSIS — Z86010 Personal history of colonic polyps: Secondary | ICD-10-CM

## 2018-04-20 MED ORDER — SODIUM CHLORIDE 0.9 % IV SOLN
500.0000 mL | Freq: Once | INTRAVENOUS | Status: DC
Start: 2018-04-20 — End: 2018-04-20

## 2018-04-20 NOTE — Progress Notes (Signed)
Report given to PACU, vss 

## 2018-04-20 NOTE — Op Note (Signed)
[facility] Patient Name: Dean Lewis Procedure Date: 04/20/2018 10:41 AM MRN: [id number] Endoscopist: Remo Lipps P. Havery Moros , MD Age: 57 Referring MD:  Date of Birth: 1961/08/12 Gender: Male Account #: [id number] Procedure:                Colonoscopy Indications:              Surveillance: Personal history of adenomatous                            polyps on last colonoscopy > 5 years ago Medicines:                Monitored Anesthesia Care Procedure:                Pre-Anesthesia Assessment:                           - Prior to the procedure, a History and Physical                            was performed, and patient medications and                            allergies were reviewed. The patient's tolerance of                            previous anesthesia was also reviewed. The risks                            and benefits of the procedure and the sedation                            options and risks were discussed with the patient.                            All questions were answered, and informed consent                            was obtained. Prior Anticoagulants: The patient has                            taken no previous anticoagulant or antiplatelet                            agents. ASA Grade Assessment: II - A patient with                            mild systemic disease. After reviewing the risks                            and benefits, the patient was deemed in                            satisfactory condition to undergo the procedure.  After obtaining informed consent, the colonoscope                            was passed under direct vision. Throughout the                            procedure, the patient's blood pressure, pulse, and                            oxygen saturations were monitored continuously. The                            Model CF-HQ190L [id number]) scope was introduced                            through the anus  and advanced to the the cecum,                            identified by appendiceal orifice and ileocecal                            valve. The colonoscopy was performed without                            difficulty. The patient tolerated the procedure                            well. The quality of the bowel preparation was                            good. The ileocecal valve, appendiceal orifice, and                            rectum were photographed. Scope In: 10:44:07 AM Scope Out: 11:02:19 AM Scope Withdrawal Time: 0 hours 14 minutes 12 seconds  Total Procedure Duration: 0 hours 18 minutes 12 seconds  Findings:                 The perianal and digital rectal examinations were                            normal.                           A diminutive polyp was found in the ascending                            colon. The polyp was sessile. The polyp was removed                            with a cold biopsy forceps. Resection and retrieval                            were complete.  A few small-mouthed diverticula were found in the                            sigmoid colon.                           The exam was otherwise without abnormality. Complications:            No immediate complications. Estimated blood loss:                            Minimal. Estimated Blood Loss:     Estimated blood loss was minimal. Impression:               - One diminutive polyp in the ascending colon,                            removed with a cold biopsy forceps. Resected and                            retrieved.                           - Diverticulosis in the sigmoid colon.                           - The examination was otherwise normal. Recommendation:           - Patient has a contact number available for                            emergencies. The signs and symptoms of potential                            delayed complications were discussed with the                             patient. Return to normal activities tomorrow.                            Written discharge instructions were provided to the                            patient.                           - Resume previous diet.                           - Continue present medications.                           - Await pathology results.                           - Repeat colonoscopy for surveillance based on  pathology results. Remo Lipps P. Demoni Gergen, MD 04/20/2018 11:04:55 AM This report has been signed electronically.

## 2018-04-20 NOTE — Progress Notes (Signed)
Pt's states no medical or surgical changes since previsit or office visit. 

## 2018-04-20 NOTE — Progress Notes (Signed)
Called to room to assist during endoscopic procedure.  Patient ID and intended procedure confirmed with present staff. Received instructions for my participation in the procedure from the performing physician.  

## 2018-04-20 NOTE — Patient Instructions (Signed)
YOU HAD AN ENDOSCOPIC PROCEDURE TODAY AT THE [facility]:   Refer to the procedure report that was given to you for any specific questions about what was found during the examination.  If the procedure report does not answer your questions, please call your gastroenterologist to clarify.  If you requested that your care partner not be given the details of your procedure findings, then the procedure report has been included in a sealed envelope for you to review at your convenience later.  YOU SHOULD EXPECT: Some feelings of bloating in the abdomen. Passage of more gas than usual.  Walking can help get rid of the air that was put into your GI tract during the procedure and reduce the bloating. If you had a lower endoscopy (such as a colonoscopy or flexible sigmoidoscopy) you may notice spotting of blood in your stool or on the toilet paper. If you underwent a bowel prep for your procedure, you may not have a normal bowel movement for a few days.  Please Note:  You might notice some irritation and congestion in your nose or some drainage.  This is from the oxygen used during your procedure.  There is no need for concern and it should clear up in a day or so.  SYMPTOMS TO REPORT IMMEDIATELY:   Following lower endoscopy (colonoscopy or flexible sigmoidoscopy):  Excessive amounts of blood in the stool  Significant tenderness or worsening of abdominal pains  Swelling of the abdomen that is new, acute  Fever of 100F or higher    For urgent or emergent issues, a gastroenterologist can be reached at any hour by calling [phone].   DIET:  We do recommend a small meal at first, but then you may proceed to your regular diet.  Drink plenty of fluids but you should avoid alcoholic beverages for 24 hours.  ACTIVITY:  You should plan to take it easy for the rest of today and you should NOT DRIVE or use heavy machinery until tomorrow (because of the sedation medicines used during the test).     FOLLOW UP: Our staff will call the number listed on your records the next business day following your procedure to check on you and address any questions or concerns that you may have regarding the information given to you following your procedure. If we do not reach you, we will leave a message.  However, if you are feeling well and you are not experiencing any problems, there is no need to return our call.  We will assume that you have returned to your regular daily activities without incident.  If any biopsies were taken you will be contacted by phone or by letter within the next 1-3 weeks.  Please call us at [phone] if you have not heard about the biopsies in 3 weeks.    SIGNATURES/CONFIDENTIALITY: You and/or your care partner have signed paperwork which will be entered into your electronic medical record.  These signatures attest to the fact that that the information above on your After Visit Summary has been reviewed and is understood.  Full responsibility of the confidentiality of this discharge information lies with you and/or your care-partner.  Polyp and diverticulosis information given. 

## 2018-04-23 ENCOUNTER — Telehealth: Payer: Self-pay

## 2018-04-23 ENCOUNTER — Telehealth: Payer: Self-pay | Admitting: *Deleted

## 2018-04-23 NOTE — Telephone Encounter (Signed)
  Follow up Call-  Call back number 04/20/2018  Post procedure Call Back phone  # [phone]  Permission to leave phone message Yes  Some recent data might be hidden     Patient questions:  Do you have a fever, pain , or abdominal swelling? No. Pain Score  0 *  Have you tolerated food without any problems? Yes.    Have you been able to return to your normal activities? Yes.    Do you have any questions about your discharge instructions: Diet   No. Medications  No. Follow up visit  No.  Do you have questions or concerns about your Care? No.  Actions: * If pain score is 4 or above: No action needed, pain <4.

## 2018-04-23 NOTE — Telephone Encounter (Signed)
  Follow up Call-  Call back number 04/20/2018  Post procedure Call Back phone  # [phone]  Permission to leave phone message Yes  Some recent data might be hidden     Patient questions:  Message left to call us if necessary.

## 2018-07-29 DIAGNOSIS — Z23 Encounter for immunization: Secondary | ICD-10-CM | POA: Diagnosis not present

## 2019-05-22 ENCOUNTER — Encounter: Payer: Self-pay | Admitting: Family Medicine

## 2019-05-22 ENCOUNTER — Ambulatory Visit (INDEPENDENT_AMBULATORY_CARE_PROVIDER_SITE_OTHER): Payer: 59 | Admitting: Family Medicine

## 2019-05-22 ENCOUNTER — Other Ambulatory Visit: Payer: Self-pay

## 2019-05-22 VITALS — BP 116/74 | HR 64 | Temp 97.0°F | Ht 74.0 in | Wt 242.5 lb

## 2019-05-22 DIAGNOSIS — Z23 Encounter for immunization: Secondary | ICD-10-CM

## 2019-05-22 DIAGNOSIS — Z125 Encounter for screening for malignant neoplasm of prostate: Secondary | ICD-10-CM | POA: Diagnosis not present

## 2019-05-22 DIAGNOSIS — R739 Hyperglycemia, unspecified: Secondary | ICD-10-CM

## 2019-05-22 DIAGNOSIS — E785 Hyperlipidemia, unspecified: Secondary | ICD-10-CM | POA: Diagnosis not present

## 2019-05-22 DIAGNOSIS — N529 Male erectile dysfunction, unspecified: Secondary | ICD-10-CM | POA: Diagnosis not present

## 2019-05-22 DIAGNOSIS — Z0001 Encounter for general adult medical examination with abnormal findings: Secondary | ICD-10-CM

## 2019-05-22 DIAGNOSIS — E669 Obesity, unspecified: Secondary | ICD-10-CM

## 2019-05-22 DIAGNOSIS — J309 Allergic rhinitis, unspecified: Secondary | ICD-10-CM | POA: Diagnosis not present

## 2019-05-22 DIAGNOSIS — Z6831 Body mass index (BMI) 31.0-31.9, adult: Secondary | ICD-10-CM

## 2019-05-22 DIAGNOSIS — R7989 Other specified abnormal findings of blood chemistry: Secondary | ICD-10-CM | POA: Diagnosis not present

## 2019-05-22 DIAGNOSIS — R1310 Dysphagia, unspecified: Secondary | ICD-10-CM

## 2019-05-22 LAB — LIPID PANEL
Cholesterol: 213 mg/dL — ABNORMAL HIGH (ref 0–200)
HDL: 53.1 mg/dL (ref >39.00)
LDL Cholesterol: 143 mg/dL — ABNORMAL HIGH (ref 0–99)
NonHDL: 160.22
Total CHOL/HDL Ratio: 4
Triglycerides: 84 mg/dL (ref 0.0–149.0)
VLDL: 16.8 mg/dL (ref 0.0–40.0)

## 2019-05-22 LAB — COMPREHENSIVE METABOLIC PANEL
ALT: 30 U/L (ref 0–53)
AST: 20 U/L (ref 0–37)
Albumin: 4.4 g/dL (ref 3.5–5.2)
Alkaline Phosphatase: 55 U/L (ref 39–117)
BUN: 12 mg/dL (ref 6–23)
CO2: 26 mEq/L (ref 19–32)
Calcium: 9.1 mg/dL (ref 8.4–10.5)
Chloride: 104 mEq/L (ref 96–112)
Creatinine, Ser: 0.72 mg/dL (ref 0.40–1.50)
GFR: 112.03 mL/min (ref >60.00)
Glucose, Bld: 94 mg/dL (ref 70–99)
Potassium: 4.1 mEq/L (ref 3.5–5.1)
Sodium: 139 mEq/L (ref 135–145)
Total Bilirubin: 0.6 mg/dL (ref 0.2–1.2)
Total Protein: 6.9 g/dL (ref 6.0–8.3)

## 2019-05-22 LAB — CBC
HCT: 44.7 % (ref 39.0–52.0)
Hemoglobin: 15.3 g/dL (ref 13.0–17.0)
MCHC: 34.2 g/dL (ref 30.0–36.0)
MCV: 87.6 fl (ref 78.0–100.0)
Platelets: 203 10*3/uL (ref 150.0–400.0)
RBC: 5.11 Mil/uL (ref 4.22–5.81)
RDW: 13.2 % (ref 11.5–15.5)
WBC: 5 10*3/uL (ref 4.0–10.5)

## 2019-05-22 LAB — HEMOGLOBIN A1C: Hgb A1c MFr Bld: 5.9 % (ref 4.6–6.5)

## 2019-05-22 LAB — PSA: PSA: 0.55 ng/mL (ref 0.10–4.00)

## 2019-05-22 LAB — TSH: TSH: 1.2 u[IU]/mL (ref 0.35–4.50)

## 2019-05-22 LAB — TESTOSTERONE: Testosterone: 294.42 ng/dL — ABNORMAL LOW (ref 300.00–890.00)

## 2019-05-22 NOTE — Assessment & Plan Note (Signed)
Continue sildenafil as needed. 

## 2019-05-22 NOTE — Assessment & Plan Note (Signed)
Check A1c. 
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
## 2019-05-22 NOTE — Assessment & Plan Note (Signed)
Check testosterone level.  Discussed risk versus benefits of starting testosterone supplementation if his levels are low again.  He will work on weight loss and being active.

## 2019-05-22 NOTE — Assessment & Plan Note (Signed)
Check lipid panel, CBC, C met, and TSH. 

## 2019-05-22 NOTE — Assessment & Plan Note (Signed)
No red flags.  Reassuring that patient symptoms been present for several years with no significant change.  Likely due to eating too fast.  May have silent reflux.  Discussed warning signs and reasons to return to care.

## 2019-05-22 NOTE — Assessment & Plan Note (Signed)
Continue cetirizine 10 mg daily.

## 2019-05-22 NOTE — Patient Instructions (Signed)
It was very nice to see you today!  Keep up the good work!  No medication changes today.  We will check blood work and give you your flu shot.  Come back to see me in 1 year for your next physical, or sooner if needed.   Take care, Dr Jerline Pain  Please try these tips to maintain a healthy lifestyle:   Eat at least 3 REAL meals and 1-2 snacks per day.  Aim for no more than 5 hours between eating.  If you eat breakfast, please do so within one hour of getting up.    Obtain twice as many fruits/vegetables as protein or carbohydrate foods for both lunch and dinner. (Half of each meal should be fruits/vegetables, one quarter protein, and one quarter starchy carbs)   Cut down on sweet beverages. This includes juice, soda, and sweet tea.    Exercise at least 150 minutes every week.    Preventive Care 39-75 Years Old, Male Preventive care refers to lifestyle choices and visits with your health care provider that can promote health and wellness. This includes:  A yearly physical exam. This is also called an annual well check.  Regular dental and eye exams.  Immunizations.  Screening for certain conditions.  Healthy lifestyle choices, such as eating a healthy diet, getting regular exercise, not using drugs or products that contain nicotine and tobacco, and limiting alcohol use. What can I expect for my preventive care visit? Physical exam Your health care provider will check:  Height and weight. These may be used to calculate body mass index (BMI), which is a measurement that tells if you are at a healthy weight.  Heart rate and blood pressure.  Your skin for abnormal spots. Counseling Your health care provider may ask you questions about:  Alcohol, tobacco, and drug use.  Emotional well-being.  Home and relationship well-being.  Sexual activity.  Eating habits.  Work and work Statistician. What immunizations do I need?  Influenza (flu) vaccine  This is  recommended every year. Tetanus, diphtheria, and pertussis (Tdap) vaccine  You may need a Td booster every 10 years. Varicella (chickenpox) vaccine  You may need this vaccine if you have not already been vaccinated. Zoster (shingles) vaccine  You may need this after age 1. Measles, mumps, and rubella (MMR) vaccine  You may need at least one dose of MMR if you were born in 1957 or later. You may also need a second dose. Pneumococcal conjugate (PCV13) vaccine  You may need this if you have certain conditions and were not previously vaccinated. Pneumococcal polysaccharide (PPSV23) vaccine  You may need one or two doses if you smoke cigarettes or if you have certain conditions. Meningococcal conjugate (MenACWY) vaccine  You may need this if you have certain conditions. Hepatitis A vaccine  You may need this if you have certain conditions or if you travel or work in places where you may be exposed to hepatitis A. Hepatitis B vaccine  You may need this if you have certain conditions or if you travel or work in places where you may be exposed to hepatitis B. Haemophilus influenzae type b (Hib) vaccine  You may need this if you have certain risk factors. Human papillomavirus (HPV) vaccine  If recommended by your health care provider, you may need three doses over 6 months. You may receive vaccines as individual doses or as more than one vaccine together in one shot (combination vaccines). Talk with your health care provider about the risks  and benefits of combination vaccines. What tests do I need? Blood tests  Lipid and cholesterol levels. These may be checked every 5 years, or more frequently if you are over 61 years old.  Hepatitis C test.  Hepatitis B test. Screening  Lung cancer screening. You may have this screening every year starting at age 52 if you have a 30-pack-year history of smoking and currently smoke or have quit within the past 15 years.  Prostate cancer  screening. Recommendations will vary depending on your family history and other risks.  Colorectal cancer screening. All adults should have this screening starting at age 58 and continuing until age 85. Your health care provider may recommend screening at age 62 if you are at increased risk. You will have tests every 1-10 years, depending on your results and the type of screening test.  Diabetes screening. This is done by checking your blood sugar (glucose) after you have not eaten for a while (fasting). You may have this done every 1-3 years.  Sexually transmitted disease (STD) testing. Follow these instructions at home: Eating and drinking  Eat a diet that includes fresh fruits and vegetables, whole grains, lean protein, and low-fat dairy products.  Take vitamin and mineral supplements as recommended by your health care provider.  Do not drink alcohol if your health care provider tells you not to drink.  If you drink alcohol: ? Limit how much you have to 0-2 drinks a day. ? Be aware of how much alcohol is in your drink. In the [place] one drink equals one 12 oz bottle of beer (355 mL), one 5 oz glass of wine (148 mL), or one 1 oz glass of hard liquor (44 mL). Lifestyle  Take daily care of your teeth and gums.  Stay active. Exercise for at least 30 minutes on 5 or more days each week.  Do not use any products that contain nicotine or tobacco, such as cigarettes, e-cigarettes, and chewing tobacco. If you need help quitting, ask your health care provider.  If you are sexually active, practice safe sex. Use a condom or other form of protection to prevent STIs (sexually transmitted infections).  Talk with your health care provider about taking a low-dose aspirin every day starting at age 88. What's next?  Go to your health care provider once a year for a well check visit.  Ask your health care provider how often you should have your eyes and teeth checked.  Stay up to date on all  vaccines. This information is not intended to replace advice given to you by your health care provider. Make sure you discuss any questions you have with your health care provider. Document Released: 09/25/2015 Document Revised: 08/23/2018 Document Reviewed: 08/23/2018 Elsevier Patient Education  2020 [employer].

## 2019-05-23 NOTE — Progress Notes (Signed)
Please inform patient of the following:  Cholesterol and blood sugar levels (A1c) are borderline but do not need to start medications. Would like for him to conintue working on diet and exercise and we can recheck in a year.  Testosterone level is slightly better than last year.  Everything else is stable.  Dean Lewis. Jerline Pain, MD 05/23/2019 8:04 AM

## 2020-01-11 IMAGING — CT CT PELVIS W/O CM
1 series · 15 of 32 positions shown, 19 images · non-contrast
Comparison: None.

CLINICAL DATA: Recent fall with pelvic pain, initial encounter

EXAM:
CT PELVIS WITHOUT CONTRAST
TECHNIQUE: Multidetector CT imaging of the pelvis was performed following the
standard protocol without intravenous contrast.

[Series 3: soft tissue pelvis/hip · axial · 0.83mm/px · z∈[-245,+22]mm · 15 of 100 slices shown, 19 images]
[im 7/100  soft-tissue]
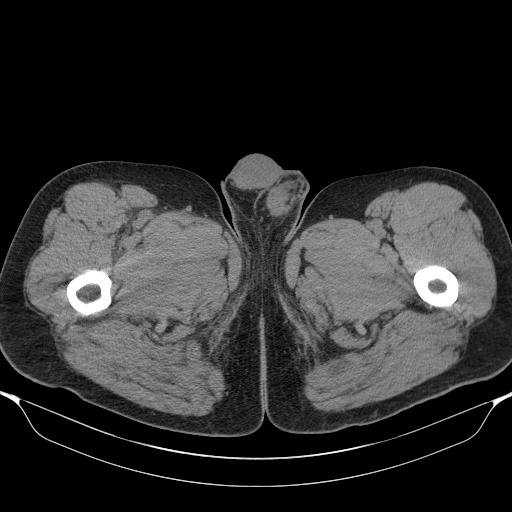
[im 7/100  bone]
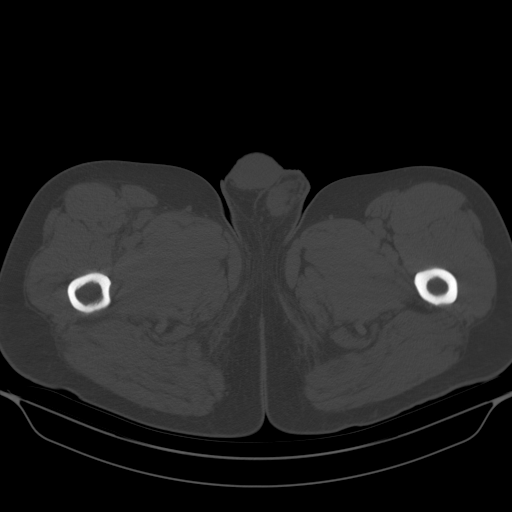
[im 13/100  soft-tissue]
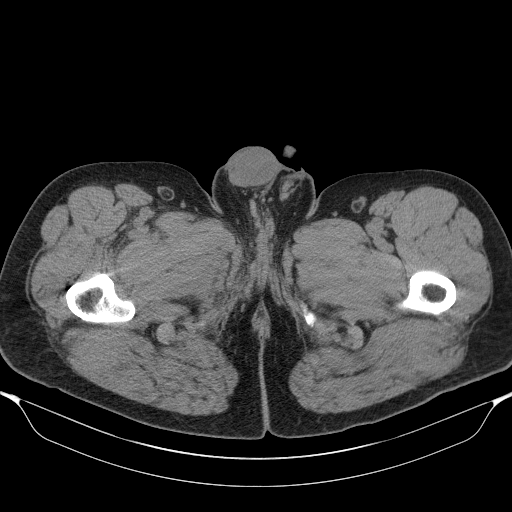
[im 20/100  soft-tissue]
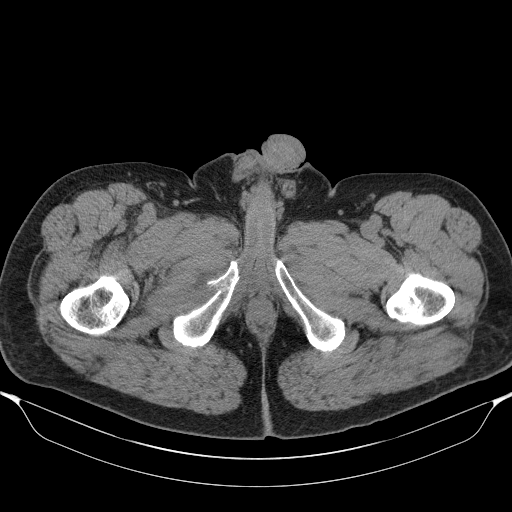
[im 29/100  soft-tissue]
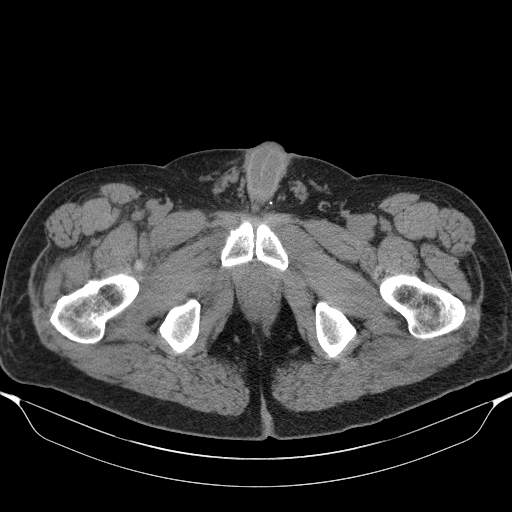
[im 36/100  soft-tissue]
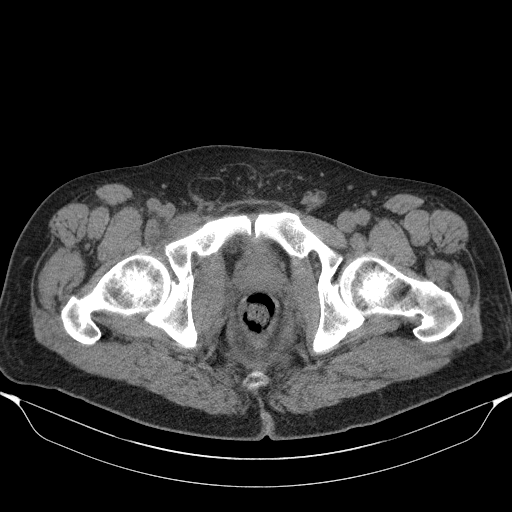
[im 42/100  soft-tissue]
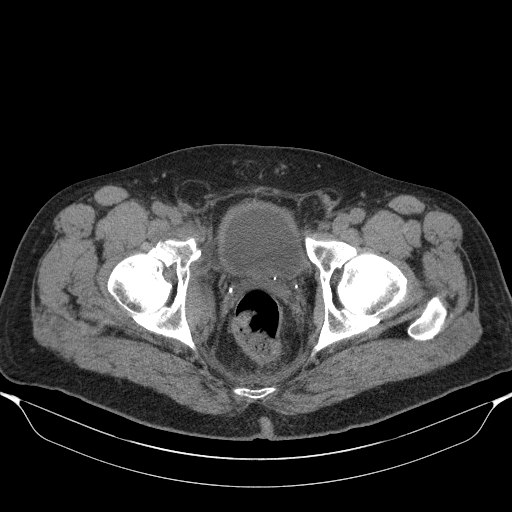
[im 52/100  soft-tissue]
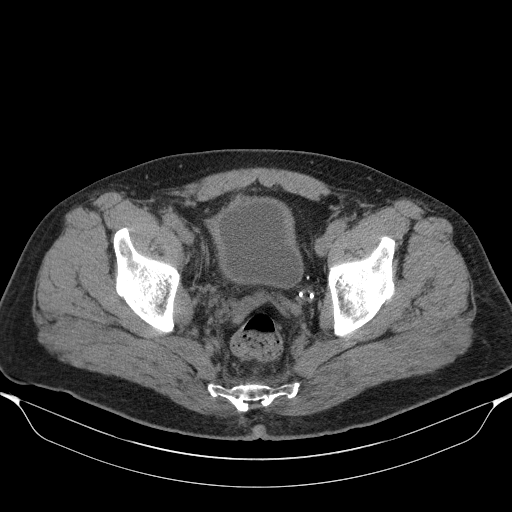
[im 58/100  soft-tissue]
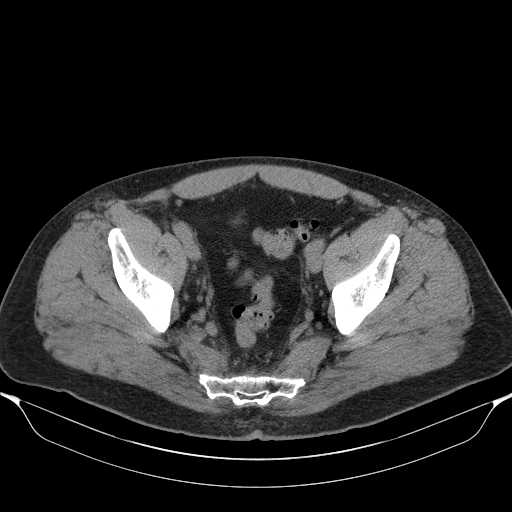
[im 64/100  soft-tissue]
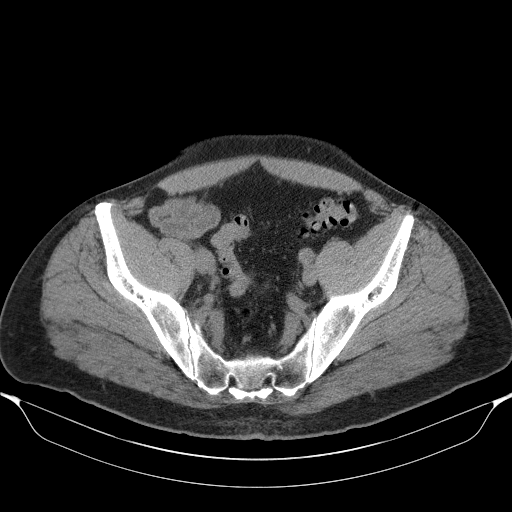
[im 64/100  bone]
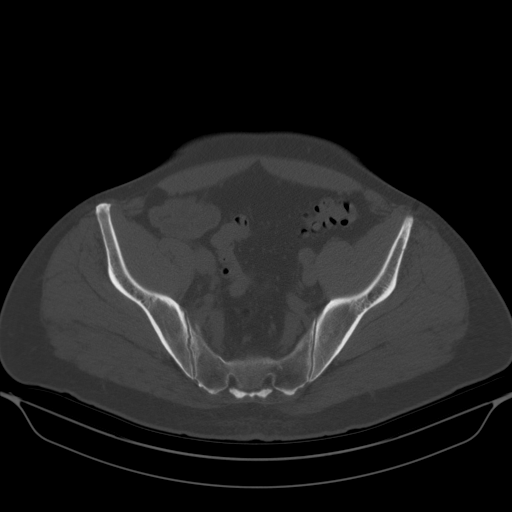
[im 71/100  soft-tissue]
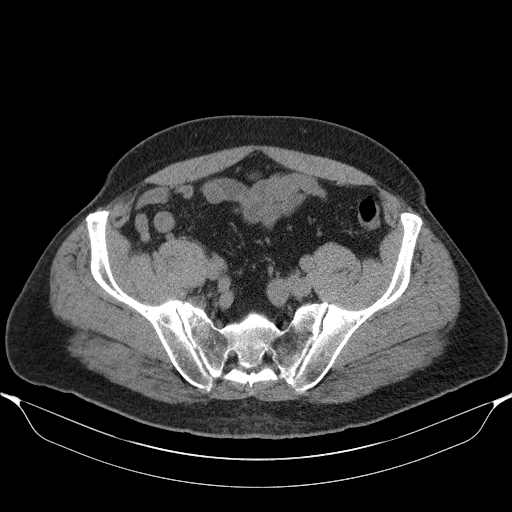
[im 80/100  soft-tissue]
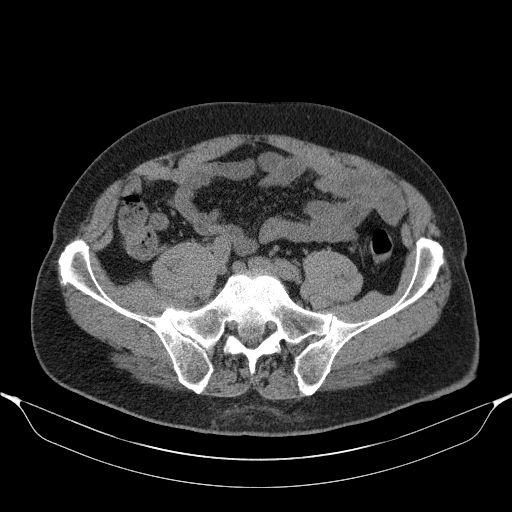
[im 87/100  soft-tissue]
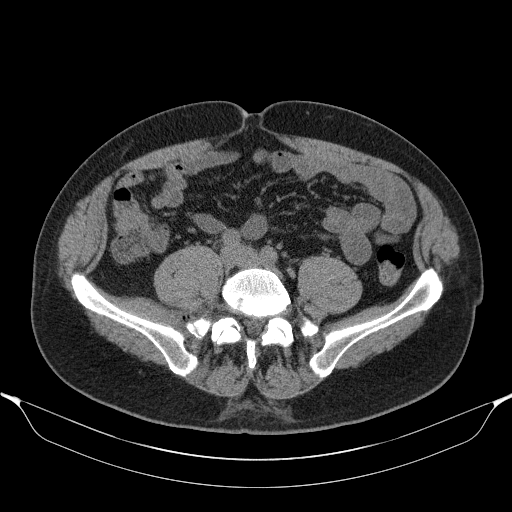
[im 87/100  lung]
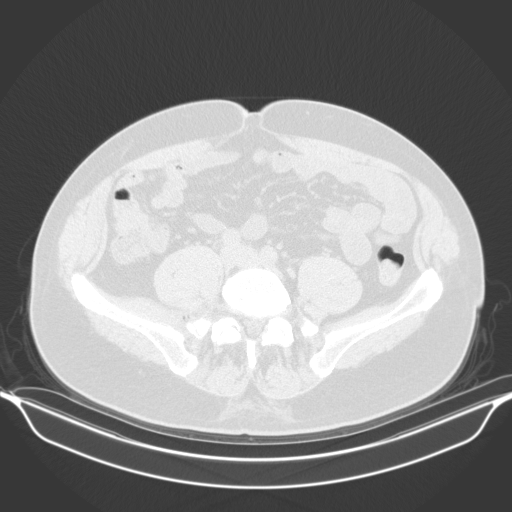
[im 90/100  lung]
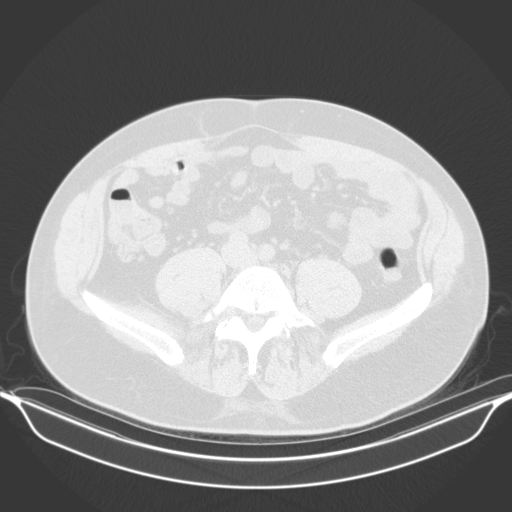
[im 93/100  soft-tissue]
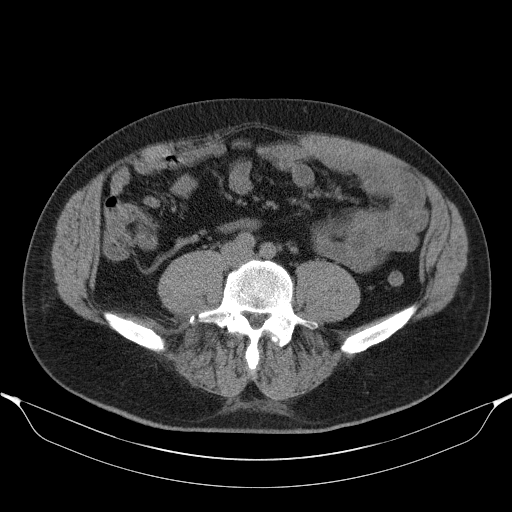
[im 93/100  lung]
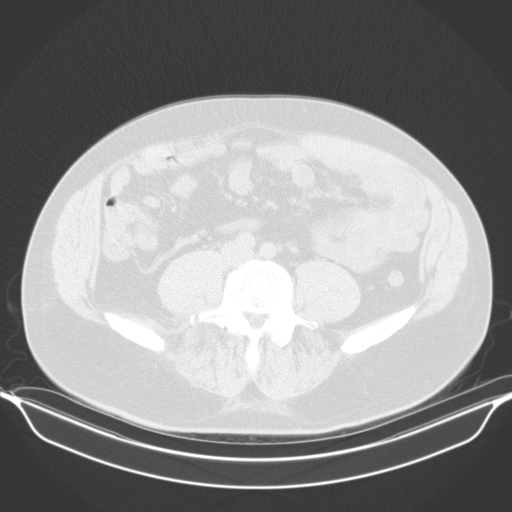
[im 96/100  lung]
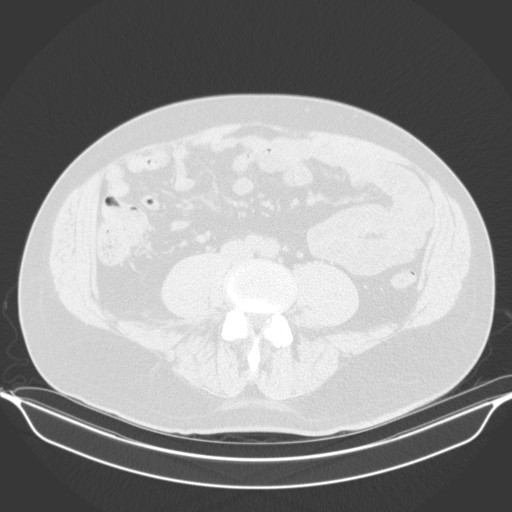

[15 of 32 positions shown; findings below may reference images not displayed]

FINDINGS: Urinary Tract: Bladder is partially distended. No focal mass lesion
is seen. No distal ureteral stones are noted.

Bowel: Mild diverticular change of the colon is seen without
evidence of diverticulitis. No obstructive or inflammatory changes
are seen. The appendix is within normal limits.

Vascular/Lymphatic: No pathologically enlarged lymph nodes. No
significant vascular abnormality seen.

Reproductive:  The prostate is within normal limits.

Other:  None.

Musculoskeletal: There are multiple fractures involving the right
hemipelvis. A complex fracture involving the right acetabulum
extending into the superior pubic ramus laterally is noted.
Comminuted inferior pubic ramus fracture is seen as well. In
addition to these fractures a vertical fracture through the right
half of the sacrum is noted as well as multiple transverse process
fractures on the right at L5. Proximal right femur is within normal
limits. No other definitive fractures are seen.
IMPRESSION: Multiple pelvic fractures on the right as described above to include
the right sacrum, right L5 transverse process, right acetabulum and
inferior and superior pubic rami on the right.

These results will be called to the ordering clinician or
representative by the Radiologist Assistant, and communication
documented in the PACS or zVision Dashboard.

## 2022-06-06 ENCOUNTER — Encounter: Payer: Self-pay | Admitting: *Deleted

## 2022-08-25 ENCOUNTER — Encounter: Payer: Self-pay | Admitting: *Deleted

## 2023-04-24 NOTE — Telephone Encounter (Signed)
Noted, PCP removed.
